# Patient Record
Sex: Female | Born: 1980 | Race: White | Hispanic: No | Smoking: Former smoker
Health system: Southern US, Community
[De-identification: ages and names within clinical notes are randomized; demographics above are authoritative.]

## PROBLEM LIST (undated history)

## (undated) DIAGNOSIS — G43909 Migraine, unspecified, not intractable, without status migrainosus: Secondary | ICD-10-CM

## (undated) DIAGNOSIS — Z98891 History of uterine scar from previous surgery: Secondary | ICD-10-CM

## (undated) DIAGNOSIS — I340 Nonrheumatic mitral (valve) insufficiency: Secondary | ICD-10-CM

## (undated) DIAGNOSIS — Z9851 Tubal ligation status: Secondary | ICD-10-CM

## (undated) HISTORY — PX: CHOLECYSTECTOMY: SHX55

---

## 2012-05-10 ENCOUNTER — Ambulatory Visit: Payer: Self-pay | Admitting: Obstetrics and Gynecology

## 2012-05-10 ENCOUNTER — Ambulatory Visit: Payer: Self-pay | Admitting: Internal Medicine

## 2014-09-15 ENCOUNTER — Emergency Department
Admission: EM | Admit: 2014-09-15 | Discharge: 2014-09-15 | Disposition: A | Discharge status: Home / Self Care | Payer: Self-pay | Attending: Emergency Medicine | Admitting: Emergency Medicine

## 2014-09-15 DIAGNOSIS — Z8669 Personal history of other diseases of the nervous system and sense organs: Secondary | ICD-10-CM | POA: Insufficient documentation

## 2014-09-15 DIAGNOSIS — Z87891 Personal history of nicotine dependence: Secondary | ICD-10-CM | POA: Insufficient documentation

## 2014-09-15 DIAGNOSIS — R519 Headache, unspecified: Secondary | ICD-10-CM

## 2014-09-15 DIAGNOSIS — M62838 Other muscle spasm: Secondary | ICD-10-CM | POA: Insufficient documentation

## 2014-09-15 DIAGNOSIS — R51 Headache: Secondary | ICD-10-CM | POA: Insufficient documentation

## 2014-09-15 HISTORY — DX: Migraine, unspecified, not intractable, without status migrainosus: G43.909

## 2014-09-15 MED ORDER — LORAZEPAM 2 MG/ML IJ SOLN
INTRAMUSCULAR | Status: AC
  Administered 2014-09-15: 1 mg via INTRAVENOUS
  Filled 2014-09-15: qty 1

## 2014-09-15 MED ORDER — HYDROMORPHONE HCL 1 MG/ML IJ SOLN
INTRAMUSCULAR | Status: AC
  Administered 2014-09-15: 0.5 mg via INTRAVENOUS
  Filled 2014-09-15: qty 1

## 2014-09-15 MED ORDER — CYCLOBENZAPRINE HCL 10 MG PO TABS: 10.0000 mg | ORAL_TABLET | Freq: Three times a day (TID) | ORAL | Status: DC | PRN

## 2014-09-15 MED ORDER — HYDROCODONE-ACETAMINOPHEN 5-325 MG PO TABS: 1.0000 | ORAL_TABLET | Freq: Four times a day (QID) | ORAL | Status: DC | PRN

## 2014-09-15 MED ORDER — KETOROLAC TROMETHAMINE 30 MG/ML IJ SOLN
INTRAMUSCULAR | Status: AC
  Administered 2014-09-15: 30 mg via INTRAVENOUS
  Filled 2014-09-15: qty 1

## 2014-09-15 MED ORDER — DIPHENHYDRAMINE HCL 50 MG/ML IJ SOLN
INTRAMUSCULAR | Status: AC
  Administered 2014-09-15: 25 mg via INTRAVENOUS
  Filled 2014-09-15: qty 1

## 2014-09-15 MED ORDER — DIPHENHYDRAMINE HCL 50 MG/ML IJ SOLN
25.0000 mg | Freq: Once | INTRAMUSCULAR | Status: AC
  Administered 2014-09-15: 25 mg via INTRAVENOUS

## 2014-09-15 MED ORDER — HYDROMORPHONE HCL 1 MG/ML IJ SOLN
0.5000 mg | Freq: Once | INTRAMUSCULAR | Status: AC
  Administered 2014-09-15: 0.5 mg via INTRAVENOUS

## 2014-09-15 MED ORDER — SODIUM CHLORIDE 0.9 % IV BOLUS (SEPSIS)
1000.0000 mL | Freq: Once | INTRAVENOUS | Status: AC
  Administered 2014-09-15: 1000 mL via INTRAVENOUS

## 2014-09-15 MED ORDER — KETOROLAC TROMETHAMINE 30 MG/ML IJ SOLN
30.0000 mg | Freq: Once | INTRAMUSCULAR | Status: AC
  Administered 2014-09-15: 30 mg via INTRAVENOUS

## 2014-09-15 MED ORDER — LORAZEPAM 2 MG/ML IJ SOLN
1.0000 mg | Freq: Once | INTRAMUSCULAR | Status: AC
  Administered 2014-09-15: 1 mg via INTRAVENOUS

## 2014-09-15 NOTE — Discharge Instructions (Signed)
No certain cause was a headache, but your exam and evaluation are reassuring. I suspect it started off due to the trapezius muscle spasm. Return to emergency department for any new or worsening headache, specifically any altered mental status, weakness, numbness, fever, skin rash, or any other symptoms concerning to you. Follow up with your doctor or the [facility] this next week. If you have continued headaches, you may call and make an appointment with a neurologist, Dr. Margaretmary Eddy office information was provided.  General Headache Without Cause A headache is pain or discomfort felt around the head or neck area. The specific cause of a headache may not be found. There are many causes and types of headaches. A few common ones are:  Tension headaches.  Migraine headaches.  Cluster headaches.  Chronic daily headaches. HOME CARE INSTRUCTIONS   Keep all follow-up appointments with your caregiver or any specialist referral.  Only take over-the-counter or prescription medicines for pain or discomfort as directed by your caregiver.  Lie down in a dark, quiet room when you have a headache.  Keep a headache journal to find out what may trigger your migraine headaches. For example, write down:  What you eat and drink.  How much sleep you get.  Any change to your diet or medicines.  Try massage or other relaxation techniques.  Put ice packs or heat on the head and neck. Use these 3 to 4 times per day for 15 to 20 minutes each time, or as needed.  Limit stress.  Sit up straight, and do not tense your muscles.  Quit smoking if you smoke.  Limit alcohol use.  Decrease the amount of caffeine you drink, or stop drinking caffeine.  Eat and sleep on a regular schedule.  Get 7 to 9 hours of sleep, or as recommended by your caregiver.  Keep lights dim if bright lights bother you and make your headaches worse. SEEK MEDICAL CARE IF:   You have problems with the medicines you were  prescribed.  Your medicines are not working.  You have a change from the usual headache.  You have nausea or vomiting. SEEK IMMEDIATE MEDICAL CARE IF:   Your headache becomes severe.  You have a fever.  You have a stiff neck.  You have loss of vision.  You have muscular weakness or loss of muscle control.  You start losing your balance or have trouble walking.  You feel faint or pass out.  You have severe symptoms that are different from your first symptoms. MAKE SURE YOU:   Understand these instructions.  Will watch your condition.  Will get help right away if you are not doing well or get worse. Document Released: 03/22/2005 Document Revised: 06/14/2011 Document Reviewed: 04/07/2011 [facility] Patient Information 2015 [place], [place]. This information is not intended to replace advice given to you by your health care provider. Make sure you discuss any questions you have with your health care provider.

## 2014-09-15 NOTE — ED Provider Notes (Signed)
[facility] Emergency Department Provider Note   ____________________________________________  Time seen: 11:45 AM I have reviewed the triage vital signs and the triage nursing note.  HISTORY  Chief Complaint Headache   Historian Patient  HPI Denise Peterson is a 34 y.o. female who is here with a complaint of headache which is been ongoing since about Wednesday and somewhat progressively worsening. She is point tender over the base of the skull especially on the right side which seems to this morning have associated numbness or tingling across the scalp on the right side. She feels like she may have some right eye tearing or pain. No specific visual changes. No recent fever, illness, skin rash, or tick bite. Her typical migraine she is able to take ibuprofen and they go away and typically her migraines are located frontally rather than at this base of the right neck. She is not having any numbness or tingling in her arm. No focal weakness.    Past Medical History  Diagnosis Date  . Migraine     There are no active problems to display for this patient.   Past Surgical History  Procedure Laterality Date  . Cholecystectomy    . Cesarean section      Current Outpatient Rx  Name  Route  Sig  Dispense  Refill  . cyclobenzaprine (FLEXERIL) 10 MG tablet   Oral   Take 1 tablet (10 mg total) by mouth every 8 (eight) hours as needed for muscle spasms.   20 tablet   0   . HYDROcodone-acetaminophen (NORCO/VICODIN) 5-325 MG per tablet   Oral   Take 1 tablet by mouth every 6 (six) hours as needed for moderate pain.   10 tablet   0     Allergies Review of patient's allergies indicates no known allergies.  No family history on file.  Social History History  Substance Use Topics  . Smoking status: Former Games developer  . Smokeless tobacco: Never Used  . Alcohol Use: Yes    Review of Systems  Constitutional: Negative for fever. Eyes: Negative for  visual changes. ENT: Negative for sore throat. Cardiovascular: Negative for chest pain. Respiratory: Negative for shortness of breath. Gastrointestinal: Negative for abdominal pain, vomiting and diarrhea. Genitourinary: Negative for dysuria. Musculoskeletal: Negative for back pain. Skin: Negative for rash. Neurological: Negative for focal weakness or numbness.  ____________________________________________   PHYSICAL EXAM:  VITAL SIGNS: ED Triage Vitals  Enc Vitals Group     BP 09/15/14 1031 130/79 mmHg     Pulse Rate 09/15/14 1031 86     Resp 09/15/14 1031 15     Temp 09/15/14 1031 97.7 F (36.5 C)     Temp Source 09/15/14 1031 Oral     SpO2 09/15/14 1031 97 %     Weight 09/15/14 1031 270 lb (122.471 kg)     Height 09/15/14 1031 5\' 6"  (1.676 m)     Head Cir --      Peak Flow --      Pain Score 09/15/14 1031 7     Pain Loc --      Pain Edu? --      Excl. in GC? --      Constitutional: Alert and oriented. Well appearing and in no distress. Eyes: Sensitive to light. Conjunctivae are normal. PERRL. Normal extraocular movements. Funduscopic exam normal bilaterally. ENT   Head: Normocephalic and atraumatic.   Nose: No congestion/rhinnorhea.   Mouth/Throat: Mucous membranes are moist.   Neck: No  C-spine tenderness. Muscle spasm at the base of the right neck and the right side of the occiput at the insertion of the trapezius. No meningismus Cardiovascular: Normal rate, regular rhythm.  No murmurs, rubs, or gallops. Respiratory: Normal respiratory effort without tachypnea nor retractions. Breath sounds are clear and equal bilaterally. No wheezes/rales/rhonchi. Gastrointestinal: Soft. No distention, no guarding, no rebound. Obese, nontender.  Genitourinary/rectal: Deferred Musculoskeletal: Nontender with normal range of motion in all extremities. No joint effusions.  No lower extremity tenderness nor edema. Neurologic:  Normal speech and language. No gross focal  neurologic deficits are appreciated. Skin:  Skin is warm, dry and intact. No rash noted. Psychiatric: Mood and affect are normal. Speech and behavior are normal. Patient exhibits appropriate insight and judgment.  ____________________________________________   EKG  None ____________________________________________  LABS (pertinent positives/negatives)  None  ____________________________________________  RADIOLOGY Radiologist results reviewed  None __________________________________________  PROCEDURES  Procedure(s) performed: None Critical Care performed: None  ____________________________________________   ED COURSE / ASSESSMENT AND PLAN  Pertinent labs & imaging results that were available during my care of the patient were reviewed by me and considered in my medical decision making (see chart for details).  Patient has what I believe to be a nonspecific headache with associated right-sided basilar occiput/trapezius muscle spasm. She has no focal neurologic deficit, and I do not suspect an intracranial emergency as a cause of her headache.. I do not suspect an ocular source of her headache such as glaucoma.   Patient much improved symptomatically after medications. I still do not suspect intracranial emergency. Patient does report being under increased stress at work as well. Return precautions and discharge instructions were provided to the patient and her spouse.   ___________________________________________   FINAL CLINICAL IMPRESSION(S) / ED DIAGNOSES   Final diagnoses:  Headache, unspecified headache type  Trapezius muscle spasm      Governor Rooks, MD 09/15/14 1457

## 2014-09-15 NOTE — ED Notes (Signed)
Pt c/o pain at the base of right side back of head for the past 2-3 days, states she has a hx of migraine, but normally in frontal with c/o dizziness today.

## 2016-11-02 ENCOUNTER — Other Ambulatory Visit: Payer: Self-pay

## 2016-11-02 ENCOUNTER — Observation Stay
Admission: EM | Admit: 2016-11-02 | Discharge: 2016-11-04 | Disposition: A | Discharge status: Home / Self Care | Payer: Medicaid Other | Attending: Internal Medicine | Admitting: Internal Medicine

## 2016-11-02 ENCOUNTER — Emergency Department: Payer: Medicaid Other

## 2016-11-02 ENCOUNTER — Encounter: Payer: Self-pay | Admitting: Emergency Medicine

## 2016-11-02 DIAGNOSIS — G43909 Migraine, unspecified, not intractable, without status migrainosus: Secondary | ICD-10-CM | POA: Diagnosis present

## 2016-11-02 DIAGNOSIS — Z9049 Acquired absence of other specified parts of digestive tract: Secondary | ICD-10-CM | POA: Insufficient documentation

## 2016-11-02 DIAGNOSIS — Z87891 Personal history of nicotine dependence: Secondary | ICD-10-CM | POA: Insufficient documentation

## 2016-11-02 DIAGNOSIS — G43109 Migraine with aura, not intractable, without status migrainosus: Secondary | ICD-10-CM | POA: Diagnosis not present

## 2016-11-02 DIAGNOSIS — R531 Weakness: Secondary | ICD-10-CM | POA: Diagnosis present

## 2016-11-02 DIAGNOSIS — G9389 Other specified disorders of brain: Secondary | ICD-10-CM | POA: Diagnosis not present

## 2016-11-02 DIAGNOSIS — I639 Cerebral infarction, unspecified: Secondary | ICD-10-CM | POA: Diagnosis present

## 2016-11-02 DIAGNOSIS — Z79899 Other long term (current) drug therapy: Secondary | ICD-10-CM | POA: Diagnosis not present

## 2016-11-02 DIAGNOSIS — I491 Atrial premature depolarization: Secondary | ICD-10-CM | POA: Insufficient documentation

## 2016-11-02 LAB — DIFFERENTIAL
Basophils Absolute: 0.2 10*3/uL — ABNORMAL HIGH (ref 0–0.1)
Basophils Relative: 1 %
EOS ABS: 0.5 10*3/uL (ref 0–0.7)
Eosinophils Relative: 3 %
LYMPHS PCT: 33 %
Lymphs Abs: 4.5 10*3/uL — ABNORMAL HIGH (ref 1.0–3.6)
Monocytes Absolute: 1 10*3/uL — ABNORMAL HIGH (ref 0.2–0.9)
Monocytes Relative: 7 %
NEUTROS PCT: 56 %
Neutro Abs: 7.3 10*3/uL — ABNORMAL HIGH (ref 1.4–6.5)

## 2016-11-02 LAB — COMPREHENSIVE METABOLIC PANEL
ALBUMIN: 4 g/dL (ref 3.5–5.0)
ALK PHOS: 59 U/L (ref 38–126)
ALT: 28 U/L (ref 14–54)
ANION GAP: 9 (ref 5–15)
AST: 28 U/L (ref 15–41)
BUN: 9 mg/dL (ref 6–20)
CALCIUM: 9.2 mg/dL (ref 8.9–10.3)
CO2: 25 mmol/L (ref 22–32)
Chloride: 104 mmol/L (ref 101–111)
Creatinine, Ser: 0.8 mg/dL (ref 0.44–1.00)
GFR calc Af Amer: >60 mL/min (ref >60)
GFR calc non Af Amer: >60 mL/min (ref >60)
Glucose, Bld: 103 mg/dL — ABNORMAL HIGH (ref 65–99)
Potassium: 3.7 mmol/L (ref 3.5–5.1)
Sodium: 138 mmol/L (ref 135–145)
Total Bilirubin: 0.4 mg/dL (ref 0.3–1.2)
Total Protein: 8 g/dL (ref 6.5–8.1)

## 2016-11-02 LAB — PROTIME-INR
INR: 0.92
Prothrombin Time: 12.4 seconds (ref 11.4–15.2)

## 2016-11-02 LAB — CBC
HCT: 40.4 % (ref 35.0–47.0)
Hemoglobin: 13.9 g/dL (ref 12.0–16.0)
MCH: 31.6 pg (ref 26.0–34.0)
MCHC: 34.3 g/dL (ref 32.0–36.0)
MCV: 91.9 fL (ref 80.0–100.0)
Platelets: 332 10*3/uL (ref 150–440)
RBC: 4.39 MIL/uL (ref 3.80–5.20)
RDW: 13.3 % (ref 11.5–14.5)
WBC: 13.4 10*3/uL — ABNORMAL HIGH (ref 3.6–11.0)

## 2016-11-02 LAB — GLUCOSE, CAPILLARY: Glucose-Capillary: 107 mg/dL — ABNORMAL HIGH (ref 65–99)

## 2016-11-02 LAB — APTT: aPTT: 26 seconds (ref 24–36)

## 2016-11-02 LAB — HCG, QUANTITATIVE, PREGNANCY

## 2016-11-02 LAB — TROPONIN I: Troponin I: <0.03 ng/mL (ref <0.03)

## 2016-11-02 MED ORDER — KETOROLAC TROMETHAMINE 30 MG/ML IJ SOLN
15.0000 mg | Freq: Once | INTRAMUSCULAR | Status: AC
  Administered 2016-11-02: 15 mg via INTRAVENOUS
  Filled 2016-11-02: qty 1

## 2016-11-02 MED ORDER — METOCLOPRAMIDE HCL 5 MG/ML IJ SOLN
10.0000 mg | Freq: Once | INTRAMUSCULAR | Status: AC
  Administered 2016-11-02: 10 mg via INTRAVENOUS
  Filled 2016-11-02: qty 2

## 2016-11-02 MED ORDER — SODIUM CHLORIDE 0.9 % IV BOLUS (SEPSIS)
1000.0000 mL | Freq: Once | INTRAVENOUS | Status: AC
  Administered 2016-11-02: 1000 mL via INTRAVENOUS

## 2016-11-02 MED ORDER — GADOBENATE DIMEGLUMINE 529 MG/ML IV SOLN
20.0000 mL | Freq: Once | INTRAVENOUS | Status: AC | PRN
  Administered 2016-11-02: 20 mL via INTRAVENOUS

## 2016-11-02 MED ORDER — DIPHENHYDRAMINE HCL 50 MG/ML IJ SOLN
25.0000 mg | Freq: Once | INTRAMUSCULAR | Status: AC
  Administered 2016-11-02: 25 mg via INTRAVENOUS
  Filled 2016-11-02: qty 1

## 2016-11-02 MED ORDER — ASPIRIN 81 MG PO CHEW
324.0000 mg | CHEWABLE_TABLET | Freq: Once | ORAL | Status: AC
  Administered 2016-11-02: 324 mg via ORAL
  Filled 2016-11-02: qty 4

## 2016-11-02 NOTE — ED Triage Notes (Signed)
Pt presents with left sided weakness and numbness since 1730 today, reports intermittent left eye vision loss, left sided headache.

## 2016-11-02 NOTE — ED Notes (Signed)
Pt alert  Speech clear.  No headache.  Skin warm and dry.  Friends with pt.  Pt waiting on admission.  Iv in place.

## 2016-11-02 NOTE — ED Notes (Signed)
SOC talking to pt. 

## 2016-11-02 NOTE — ED Notes (Signed)
Pt taken to MRI by Jill AlexandersJustin, MRI tech.

## 2016-11-02 NOTE — ED Notes (Signed)
Family member at bedside.

## 2016-11-02 NOTE — ED Notes (Signed)
Pt in CT.

## 2016-11-02 NOTE — ED Notes (Signed)
Pt denies wanting TPA

## 2016-11-02 NOTE — H&P (Signed)
[facility] [place] at [facility]   PATIENT NAME: Denise Peterson Whitson    MR#:  [id number]  DATE OF BIRTH:  07/11/1980  DATE OF ADMISSION:  11/02/2016  PRIMARY CARE PHYSICIAN: Patient, No Pcp Per   REQUESTING/REFERRING PHYSICIAN: Don PerkingVeronese, MD  CHIEF COMPLAINT:   Chief Complaint  Patient presents with  . Code Stroke    HISTORY OF PRESENT ILLNESS:  Denise Peterson Lizarraga  is a 36 y.o. female who presents with Left hemisensory deficit. Patient states that her left side feels heavy, like it is "asleep". Here in the ED patient got an MRI which did not show any acute pathology, but it did show a region of her cerebellum concerning for possible remote stroke. MRA and MRV were normal. Patient doesn't history of migraines, states that she did have a headache on the left side of her head today. Her systems are somewhat persistent here in the ED, improving some when she got migraine treatment medications. Hospitalists were called for admission  PAST MEDICAL HISTORY:   Past Medical History:  Diagnosis Date  . Migraine     PAST SURGICAL HISTORY:   Past Surgical History:  Procedure Laterality Date  . CESAREAN SECTION    . CHOLECYSTECTOMY      SOCIAL HISTORY:   Social History  Substance Use Topics  . Smoking status: Former Games developermoker  . Smokeless tobacco: Never Used  . Alcohol use Yes    FAMILY HISTORY:  No family history on file.  DRUG ALLERGIES:  No Known Allergies  MEDICATIONS AT HOME:   Prior to Admission medications   Medication Sig Start Date End Date Taking? Authorizing Provider  cyclobenzaprine (FLEXERIL) 10 MG tablet Take 1 tablet (10 mg total) by mouth every 8 (eight) hours as needed for muscle spasms. Patient not taking: Reported on 11/02/2016 09/15/14   Governor RooksLord, Rebecca, MD  HYDROcodone-acetaminophen (NORCO/VICODIN) 5-325 MG per tablet Take 1 tablet by mouth every 6 (six) hours as needed for moderate pain. Patient not taking: Reported on 11/02/2016  09/15/14   Governor RooksLord, Rebecca, MD    REVIEW OF SYSTEMS:  Review of Systems  Constitutional: Negative for chills, fever, malaise/fatigue and weight loss.  HENT: Negative for ear pain, hearing loss and tinnitus.   Eyes: Negative for blurred vision, double vision, pain and redness.  Respiratory: Negative for cough, hemoptysis and shortness of breath.   Cardiovascular: Negative for chest pain, palpitations, orthopnea and leg swelling.  Gastrointestinal: Negative for abdominal pain, constipation, diarrhea, nausea and vomiting.  Genitourinary: Negative for dysuria, frequency and hematuria.  Musculoskeletal: Negative for back pain, joint pain and neck pain.  Skin:       No acne, rash, or lesions  Neurological: Positive for sensory change and headaches. Negative for dizziness, tremors, focal weakness and weakness.  Endo/Heme/Allergies: Negative for polydipsia. Does not bruise/bleed easily.  Psychiatric/Behavioral: Negative for depression. The patient is not nervous/anxious and does not have insomnia.      VITAL SIGNS:   Vitals:   11/02/16 2000 11/02/16 2031 11/02/16 2230 11/02/16 2300  BP: 101/64 115/64 110/77 104/84  Pulse: 75  72 75  Resp: (!) 22 16 14  (!) 22  Temp:      TempSrc:      SpO2: 96%  97% 98%  Weight:      Height:       Wt Readings from Last 3 Encounters:  11/02/16 122.5 kg (270 lb)  09/15/14 122.5 kg (270 lb)    PHYSICAL EXAMINATION:  Physical Exam  Vitals reviewed.  Constitutional: She is oriented to person, place, and time. She appears well-developed and well-nourished. No distress.  HENT:  Head: Normocephalic and atraumatic.  Mouth/Throat: Oropharynx is clear and moist.  Eyes: Pupils are equal, round, and reactive to light. Conjunctivae and EOM are normal. No scleral icterus.  Neck: Normal range of motion. Neck supple. No JVD present. No thyromegaly present.  Cardiovascular: Normal rate, regular rhythm and intact distal pulses.  Exam reveals no gallop and no friction  rub.   No murmur heard. Respiratory: Effort normal and breath sounds normal. No respiratory distress. She has no wheezes. She has no rales.  GI: Soft. Bowel sounds are normal. She exhibits no distension. There is no tenderness.  Musculoskeletal: Normal range of motion. She exhibits no edema.  No arthritis, no gout  Lymphadenopathy:    She has no cervical adenopathy.  Neurological: She is alert and oriented to person, place, and time. No cranial nerve deficit.  Neurologic: Cranial nerves II-XII intact, Sensation intact to light touch/pinprick on the right side, decreased sensation to light touch on the left face and upper and lower extremities, 5/5 strength in right upper and lower extremities, 4/5 strength in left upper and lower extremities, no dysarthria, no aphasia, no dysphagia, memory intact, no pronator drift  Skin: Skin is warm and dry. No rash noted. No erythema.  Psychiatric: She has a normal mood and affect. Her behavior is normal. Judgment and thought content normal.    LABORATORY PANEL:   CBC  Recent Labs Lab 11/02/16 1816  WBC 13.4*  HGB 13.9  HCT 40.4  PLT 332   ------------------------------------------------------------------------------------------------------------------  Chemistries   Recent Labs Lab 11/02/16 1816  NA 138  K 3.7  CL 104  CO2 25  GLUCOSE 103*  BUN 9  CREATININE 0.80  CALCIUM 9.2  AST 28  ALT 28  ALKPHOS 59  BILITOT 0.4   ------------------------------------------------------------------------------------------------------------------  Cardiac Enzymes  Recent Labs Lab 11/02/16 1816  TROPONINI <0.03   ------------------------------------------------------------------------------------------------------------------  RADIOLOGY:  Mr Maxine Glenn Head Wo Contrast  Result Date: 11/02/2016 CLINICAL DATA:  Initial evaluation for left-sided weakness and numbness with intermittent left-sided visual loss, left-sided headache. EXAM: MRI HEAD  WITHOUT CONTRAST MRA HEAD WITHOUT CONTRAST MRA NECK WITHOUT AND WITH CONTRAST MRV HEAD WITHOUT CONTRAST TECHNIQUE: Multiplanar, multiecho pulse sequences of the brain and surrounding structures were obtained without intravenous contrast. Angiographic images of the Circle of Rylei Masella were obtained using MRA technique without intravenous contrast. Angiographic images of the neck were obtained using MRA technique without and with intravenous contrast. Carotid stenosis measurements (when applicable) are obtained utilizing NASCET criteria, using the distal internal carotid diameter as the denominator. CONTRAST:  20mL MULTIHANCE GADOBENATE DIMEGLUMINE 529 MG/ML IV SOLN COMPARISON:  Prior CT from earlier the same day. FINDINGS: MRI HEAD FINDINGS Cerebral volume within normal limits for patient age. Few scattered T2/FLAIR hyperintense foci noted within the periventricular and deep white matter of the left cerebral hemisphere. Most prominent of these foci in noted within the left periatrial white matter node and measures 1 cm (series 8, image 14). These foci are nonspecific. Two adjacent foci of encephalomalacia within the inferior left cerebellar hemisphere suspected to be related to remote ischemia (series 7, image 6). No abnormal foci of restricted diffusion to suggest acute or subacute ischemia. Gray-white matter differentiation otherwise maintained. No other evidence for chronic infarction. Single tiny foci of susceptibility artifact within the mid pons noted, consistent with a small chronic microhemorrhage (series 9, image 9). No other evidence for acute or  chronic intracranial hemorrhage. No mass lesion, midline shift, or mass effect. No hydrocephalus. No extra-axial fluid collection. Major dural sinuses are grossly patent. Incidental note made of a partially empty sella. Pituitary gland suprasellar region otherwise unremarkable. Midline structures intact and normal. Major intracranial vascular flow voids are  maintained. Craniocervical junction within normal limits. Visualized upper cervical spine unremarkable. Bone marrow signal intensity within normal limits. No scalp soft tissue abnormality. Globes and orbital soft tissues within normal limits. Paranasal sinuses are clear. No mastoid effusion. Inner ear structures grossly normal. MRA HEAD FINDINGS ANTERIOR CIRCULATION: Distal cervical segments of the internal carotid arteries are widely patent with antegrade flow. Petrous, cavernous, and supraclinoid segments widely patent bilaterally without stenosis. Origin of the ophthalmic arteries patent. ICA termini widely patent. A1 segments widely patent. Anterior cerebral arteries widely patent to their distal aspects. M1 segments patent without stenosis or occlusion. MCA bifurcations normal. No proximal M2 occlusion. Distal MCA branches well opacified and symmetric parent POSTERIOR CIRCULATION: Vertebral artery's patent to the vertebrobasilar junction without stenosis. Posterior inferior cerebral arteries patent proximally. Basilar artery widely patent. Superior cerebral arteries patent bilaterally. Both of the posterior cerebral arteries primarily supplied via the basilar and are widely patent to their distal aspects. Small bilateral posterior communicating arteries noted. No aneurysm or vascular malformation. MRV HEAD FINDINGS: The superior sagittal sinus widely patent. Torcula widely patent. Right transverse and sigmoid sinuses are widely patent and dominant. Proximal right internal jugular vein patent. Left transverse and sigmoid sinuses are hypoplastic but patent as well. Proximal left internal jugular vein patent. No obvious abrupt cut off to suggest dural sinus stenosis. Straight sinus, vein of Galen, and internal cerebral veins patent bilaterally. No obvious cortical vein thrombosis. The MRA NECK FINDINGS Source images reviewed. Visualized aortic arch of normal caliber with normal branch pattern. No high-grade  flow-limiting stenosis about the origin of the great vessels. Visualized subclavian arteries widely patent. Right common and internal carotid artery's are widely patent from their origins to the skullbase without stenosis or occlusion. No significant atheromatous irregularity or narrowing about the right carotid bifurcation. Left common and internal carotid arteries are widely patent from their origins to the skullbase without stenosis or occlusion. No significant atheromatous irregularity or narrowing about the left carotid bifurcation. Both of the vertebral arteries arise from the subclavian arteries. Vertebral arteries are largely code dominant. Vertebral arteries widely patent within the neck without stenosis or occlusion. IMPRESSION: MRI HEAD IMPRESSION: 1. No acute intracranial process identified. 2. Few patchy T2/FLAIR hyperintense foci involving the periventricular and deep white matter of the left cerebral hemisphere as above, somewhat advanced for age. These foci are nonspecific. Primary differential considerations include accelerated chronic small vessel ischemic disease, sequelae of migrainous disorder, vasculitis, or other prior infectious or inflammatory process. Although not classic for demyelinating disease, this is not entirely excluded, and could be considered in the correct clinical setting. 3. 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 4. Otherwise normal brain MRI. MRA HEAD IMPRESSION: Normal intracranial MRA. MRV HEAD IMPRESSION: Normal intracranial MRV. MRA NECK IMPRESSION: Normal MRA of the neck. Electronically Signed   By: Rise Mu M.D.   On: 11/02/2016 22:41   Mr Angiogram Neck W Or Wo Contrast  Result Date: 11/02/2016 CLINICAL DATA:  Initial evaluation for left-sided weakness and numbness with intermittent left-sided visual loss, left-sided headache. EXAM: MRI HEAD WITHOUT CONTRAST MRA  HEAD WITHOUT CONTRAST MRA NECK WITHOUT AND WITH CONTRAST MRV HEAD  WITHOUT CONTRAST TECHNIQUE: Multiplanar, multiecho pulse sequences of the brain and surrounding structures were obtained without intravenous contrast. Angiographic images of the Circle of Jakevious Hollister were obtained using MRA technique without intravenous contrast. Angiographic images of the neck were obtained using MRA technique without and with intravenous contrast. Carotid stenosis measurements (when applicable) are obtained utilizing NASCET criteria, using the distal internal carotid diameter as the denominator. CONTRAST:  20mL MULTIHANCE GADOBENATE DIMEGLUMINE 529 MG/ML IV SOLN COMPARISON:  Prior CT from earlier the same day. FINDINGS: MRI HEAD FINDINGS Cerebral volume within normal limits for patient age. Few scattered T2/FLAIR hyperintense foci noted within the periventricular and deep white matter of the left cerebral hemisphere. Most prominent of these foci in noted within the left periatrial white matter node and measures 1 cm (series 8, image 14). These foci are nonspecific. Two adjacent foci of encephalomalacia within the inferior left cerebellar hemisphere suspected to be related to remote ischemia (series 7, image 6). No abnormal foci of restricted diffusion to suggest acute or subacute ischemia. Gray-white matter differentiation otherwise maintained. No other evidence for chronic infarction. Single tiny foci of susceptibility artifact within the mid pons noted, consistent with a small chronic microhemorrhage (series 9, image 9). No other evidence for acute or chronic intracranial hemorrhage. No mass lesion, midline shift, or mass effect. No hydrocephalus. No extra-axial fluid collection. Major dural sinuses are grossly patent. Incidental note made of a partially empty sella. Pituitary gland suprasellar region otherwise unremarkable. Midline structures intact and normal. Major intracranial vascular flow voids are maintained. Craniocervical  junction within normal limits. Visualized upper cervical spine unremarkable. Bone marrow signal intensity within normal limits. No scalp soft tissue abnormality. Globes and orbital soft tissues within normal limits. Paranasal sinuses are clear. No mastoid effusion. Inner ear structures grossly normal. MRA HEAD FINDINGS ANTERIOR CIRCULATION: Distal cervical segments of the internal carotid arteries are widely patent with antegrade flow. Petrous, cavernous, and supraclinoid segments widely patent bilaterally without stenosis. Origin of the ophthalmic arteries patent. ICA termini widely patent. A1 segments widely patent. Anterior cerebral arteries widely patent to their distal aspects. M1 segments patent without stenosis or occlusion. MCA bifurcations normal. No proximal M2 occlusion. Distal MCA branches well opacified and symmetric parent POSTERIOR CIRCULATION: Vertebral artery's patent to the vertebrobasilar junction without stenosis. Posterior inferior cerebral arteries patent proximally. Basilar artery widely patent. Superior cerebral arteries patent bilaterally. Both of the posterior cerebral arteries primarily supplied via the basilar and are widely patent to their distal aspects. Small bilateral posterior communicating arteries noted. No aneurysm or vascular malformation. MRV HEAD FINDINGS: The superior sagittal sinus widely patent. Torcula widely patent. Right transverse and sigmoid sinuses are widely patent and dominant. Proximal right internal jugular vein patent. Left transverse and sigmoid sinuses are hypoplastic but patent as well. Proximal left internal jugular vein patent. No obvious abrupt cut off to suggest dural sinus stenosis. Straight sinus, vein of Galen, and internal cerebral veins patent bilaterally. No obvious cortical vein thrombosis. The MRA NECK FINDINGS Source images reviewed. Visualized aortic arch of normal caliber with normal branch pattern. No high-grade flow-limiting stenosis about the  origin of the great vessels. Visualized subclavian arteries widely patent. Right common and internal carotid artery's are widely patent from their origins to the skullbase without stenosis or occlusion. No significant atheromatous irregularity or narrowing about the right carotid bifurcation. Left common and internal carotid arteries are widely patent from their origins to the skullbase without stenosis or occlusion. No significant atheromatous irregularity or narrowing about the left carotid  bifurcation. Both of the vertebral arteries arise from the subclavian arteries. Vertebral arteries are largely code dominant. Vertebral arteries widely patent within the neck without stenosis or occlusion. IMPRESSION: MRI HEAD IMPRESSION: 1. No acute intracranial process identified. 2. Few patchy T2/FLAIR hyperintense foci involving the periventricular and deep white matter of the left cerebral hemisphere as above, somewhat advanced for age. These foci are nonspecific. Primary differential considerations include accelerated chronic small vessel ischemic disease, sequelae of migrainous disorder, vasculitis, or other prior infectious or inflammatory process. Although not classic for demyelinating disease, this is not entirely excluded, and could be considered in the correct clinical setting. 3. 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 4. Otherwise normal brain MRI. MRA HEAD IMPRESSION: Normal intracranial MRA. MRV HEAD IMPRESSION: Normal intracranial MRV. MRA NECK IMPRESSION: Normal MRA of the neck. Electronically Signed   By: Rise MuBenjamin  McClintock M.D.   On: 11/02/2016 22:41   Mr Brain Wo Contrast  Result Date: 11/02/2016 CLINICAL DATA:  Initial evaluation for left-sided weakness and numbness with intermittent left-sided visual loss, left-sided headache. EXAM: MRI HEAD WITHOUT CONTRAST MRA HEAD WITHOUT CONTRAST MRA NECK WITHOUT AND  WITH CONTRAST MRV HEAD WITHOUT CONTRAST TECHNIQUE: Multiplanar, multiecho pulse sequences of the brain and surrounding structures were obtained without intravenous contrast. Angiographic images of the Circle of Humza [place]erico were obtained using MRA technique without intravenous contrast. Angiographic images of the neck were obtained using MRA technique without and with intravenous contrast. Carotid stenosis measurements (when applicable) are obtained utilizing NASCET criteria, using the distal internal carotid diameter as the denominator. CONTRAST:  20mL MULTIHANCE GADOBENATE DIMEGLUMINE 529 MG/ML IV SOLN COMPARISON:  Prior CT from earlier the same day. FINDINGS: MRI HEAD FINDINGS Cerebral volume within normal limits for patient age. Few scattered T2/FLAIR hyperintense foci noted within the periventricular and deep white matter of the left cerebral hemisphere. Most prominent of these foci in noted within the left periatrial white matter node and measures 1 cm (series 8, image 14). These foci are nonspecific. Two adjacent foci of encephalomalacia within the inferior left cerebellar hemisphere suspected to be related to remote ischemia (series 7, image 6). No abnormal foci of restricted diffusion to suggest acute or subacute ischemia. Gray-white matter differentiation otherwise maintained. No other evidence for chronic infarction. Single tiny foci of susceptibility artifact within the mid pons noted, consistent with a small chronic microhemorrhage (series 9, image 9). No other evidence for acute or chronic intracranial hemorrhage. No mass lesion, midline shift, or mass effect. No hydrocephalus. No extra-axial fluid collection. Major dural sinuses are grossly patent. Incidental note made of a partially empty sella. Pituitary gland suprasellar region otherwise unremarkable. Midline structures intact and normal. Major intracranial vascular flow voids are maintained. Craniocervical junction within normal limits. Visualized upper  cervical spine unremarkable. Bone marrow signal intensity within normal limits. No scalp soft tissue abnormality. Globes and orbital soft tissues within normal limits. Paranasal sinuses are clear. No mastoid effusion. Inner ear structures grossly normal. MRA HEAD FINDINGS ANTERIOR CIRCULATION: Distal cervical segments of the internal carotid arteries are widely patent with antegrade flow. Petrous, cavernous, and supraclinoid segments widely patent bilaterally without stenosis. Origin of the ophthalmic arteries patent. ICA termini widely patent. A1 segments widely patent. Anterior cerebral arteries widely patent to their distal aspects. M1 segments patent without stenosis or occlusion. MCA bifurcations normal. No proximal M2 occlusion. Distal MCA branches well opacified and symmetric parent POSTERIOR CIRCULATION: Vertebral artery's patent to the vertebrobasilar junction  without stenosis. Posterior inferior cerebral arteries patent proximally. Basilar artery widely patent. Superior cerebral arteries patent bilaterally. Both of the posterior cerebral arteries primarily supplied via the basilar and are widely patent to their distal aspects. Small bilateral posterior communicating arteries noted. No aneurysm or vascular malformation. MRV HEAD FINDINGS: The superior sagittal sinus widely patent. Torcula widely patent. Right transverse and sigmoid sinuses are widely patent and dominant. Proximal right internal jugular vein patent. Left transverse and sigmoid sinuses are hypoplastic but patent as well. Proximal left internal jugular vein patent. No obvious abrupt cut off to suggest dural sinus stenosis. Straight sinus, vein of Galen, and internal cerebral veins patent bilaterally. No obvious cortical vein thrombosis. The MRA NECK FINDINGS Source images reviewed. Visualized aortic arch of normal caliber with normal branch pattern. No high-grade flow-limiting stenosis about the origin of the great vessels. Visualized  subclavian arteries widely patent. Right common and internal carotid artery's are widely patent from their origins to the skullbase without stenosis or occlusion. No significant atheromatous irregularity or narrowing about the right carotid bifurcation. Left common and internal carotid arteries are widely patent from their origins to the skullbase without stenosis or occlusion. No significant atheromatous irregularity or narrowing about the left carotid bifurcation. Both of the vertebral arteries arise from the subclavian arteries. Vertebral arteries are largely code dominant. Vertebral arteries widely patent within the neck without stenosis or occlusion. IMPRESSION: MRI HEAD IMPRESSION: 1. No acute intracranial process identified. 2. Few patchy T2/FLAIR hyperintense foci involving the periventricular and deep white matter of the left cerebral hemisphere as above, somewhat advanced for age. These foci are nonspecific. Primary differential considerations include accelerated chronic small vessel ischemic disease, sequelae of migrainous disorder, vasculitis, or other prior infectious or inflammatory process. Although not classic for demyelinating disease, this is not entirely excluded, and could be considered in the correct clinical setting. 3. 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 4. Otherwise normal brain MRI. MRA HEAD IMPRESSION: Normal intracranial MRA. MRV HEAD IMPRESSION: Normal intracranial MRV. MRA NECK IMPRESSION: Normal MRA of the neck. Electronically Signed   By: Rise Mu M.D.   On: 11/02/2016 22:41   Mr Mrv Head Wo Cm  Result Date: 11/02/2016 CLINICAL DATA:  Initial evaluation for left-sided weakness and numbness with intermittent left-sided visual loss, left-sided headache. EXAM: MRI HEAD WITHOUT CONTRAST MRA HEAD WITHOUT CONTRAST MRA NECK WITHOUT AND WITH CONTRAST MRV HEAD WITHOUT CONTRAST  TECHNIQUE: Multiplanar, multiecho pulse sequences of the brain and surrounding structures were obtained without intravenous contrast. Angiographic images of the Circle of Habeeb Puertas were obtained using MRA technique without intravenous contrast. Angiographic images of the neck were obtained using MRA technique without and with intravenous contrast. Carotid stenosis measurements (when applicable) are obtained utilizing NASCET criteria, using the distal internal carotid diameter as the denominator. CONTRAST:  20mL MULTIHANCE GADOBENATE DIMEGLUMINE 529 MG/ML IV SOLN COMPARISON:  Prior CT from earlier the same day. FINDINGS: MRI HEAD FINDINGS Cerebral volume within normal limits for patient age. Few scattered T2/FLAIR hyperintense foci noted within the periventricular and deep white matter of the left cerebral hemisphere. Most prominent of these foci in noted within the left periatrial white matter node and measures 1 cm (series 8, image 14). These foci are nonspecific. Two adjacent foci of encephalomalacia within the inferior left cerebellar hemisphere suspected to be related to remote ischemia (series 7, image 6). No abnormal foci of restricted diffusion to suggest acute or subacute ischemia. Gray-white  matter differentiation otherwise maintained. No other evidence for chronic infarction. Single tiny foci of susceptibility artifact within the mid pons noted, consistent with a small chronic microhemorrhage (series 9, image 9). No other evidence for acute or chronic intracranial hemorrhage. No mass lesion, midline shift, or mass effect. No hydrocephalus. No extra-axial fluid collection. Major dural sinuses are grossly patent. Incidental note made of a partially empty sella. Pituitary gland suprasellar region otherwise unremarkable. Midline structures intact and normal. Major intracranial vascular flow voids are maintained. Craniocervical junction within normal limits. Visualized upper cervical spine unremarkable. Bone  marrow signal intensity within normal limits. No scalp soft tissue abnormality. Globes and orbital soft tissues within normal limits. Paranasal sinuses are clear. No mastoid effusion. Inner ear structures grossly normal. MRA HEAD FINDINGS ANTERIOR CIRCULATION: Distal cervical segments of the internal carotid arteries are widely patent with antegrade flow. Petrous, cavernous, and supraclinoid segments widely patent bilaterally without stenosis. Origin of the ophthalmic arteries patent. ICA termini widely patent. A1 segments widely patent. Anterior cerebral arteries widely patent to their distal aspects. M1 segments patent without stenosis or occlusion. MCA bifurcations normal. No proximal M2 occlusion. Distal MCA branches well opacified and symmetric parent POSTERIOR CIRCULATION: Vertebral artery's patent to the vertebrobasilar junction without stenosis. Posterior inferior cerebral arteries patent proximally. Basilar artery widely patent. Superior cerebral arteries patent bilaterally. Both of the posterior cerebral arteries primarily supplied via the basilar and are widely patent to their distal aspects. Small bilateral posterior communicating arteries noted. No aneurysm or vascular malformation. MRV HEAD FINDINGS: The superior sagittal sinus widely patent. Torcula widely patent. Right transverse and sigmoid sinuses are widely patent and dominant. Proximal right internal jugular vein patent. Left transverse and sigmoid sinuses are hypoplastic but patent as well. Proximal left internal jugular vein patent. No obvious abrupt cut off to suggest dural sinus stenosis. Straight sinus, vein of Galen, and internal cerebral veins patent bilaterally. No obvious cortical vein thrombosis. The MRA NECK FINDINGS Source images reviewed. Visualized aortic arch of normal caliber with normal branch pattern. No high-grade flow-limiting stenosis about the origin of the great vessels. Visualized subclavian arteries widely patent. Right  common and internal carotid artery's are widely patent from their origins to the skullbase without stenosis or occlusion. No significant atheromatous irregularity or narrowing about the right carotid bifurcation. Left common and internal carotid arteries are widely patent from their origins to the skullbase without stenosis or occlusion. No significant atheromatous irregularity or narrowing about the left carotid bifurcation. Both of the vertebral arteries arise from the subclavian arteries. Vertebral arteries are largely code dominant. Vertebral arteries widely patent within the neck without stenosis or occlusion. IMPRESSION: MRI HEAD IMPRESSION: 1. No acute intracranial process identified. 2. Few patchy T2/FLAIR hyperintense foci involving the periventricular and deep white matter of the left cerebral hemisphere as above, somewhat advanced for age. These foci are nonspecific. Primary differential considerations include accelerated chronic small vessel ischemic disease, sequelae of migrainous disorder, vasculitis, or other prior infectious or inflammatory process. Although not classic for demyelinating disease, this is not entirely excluded, and could be considered in the correct clinical setting. 3. 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 4. Otherwise normal brain MRI. MRA HEAD IMPRESSION: Normal intracranial MRA. MRV HEAD IMPRESSION: Normal intracranial MRV. MRA NECK IMPRESSION: Normal MRA of the neck. Electronically Signed   By: Rise Mu M.D.   On: 11/02/2016 22:41   Ct Head Code Stroke W/o Cm  Result Date:  11/02/2016 CLINICAL DATA:  Code stroke. Left-sided numbness. Onset 45 minutes prior to admission EXAM: CT HEAD WITHOUT CONTRAST TECHNIQUE: Contiguous axial images were obtained from the base of the skull through the vertex without intravenous contrast. COMPARISON:  None. FINDINGS: Brain: No evidence of  acute infarction, hemorrhage, hydrocephalus, extra-axial collection or mass lesion/mass effect. Vascular: No hyperdense vessel or unexpected calcification. Skull: Negative Sinuses/Orbits: Negative Other: None ASPECTS ([place]berta Stroke Program Early CT Score) - Ganglionic level infarction (caudate, lentiform nuclei, internal capsule, insula, M1-M3 cortex): 7 - Supraganglionic infarction (M4-M6 cortex): 3 Total score (0-10 with 10 being normal): 10 IMPRESSION: 1. Negative CT Head 2. ASPECTS is 10 These results were called by telephone at the time of interpretation on 11/02/2016 at 6:20 pm to Dr. Roxan Hockey, who verbally acknowledged these results. Electronically Signed   By: Marlan Palau M.D.   On: 11/02/2016 18:21    EKG:   Orders placed or performed during the hospital encounter of 11/02/16  . ED EKG  . ED EKG    IMPRESSION AND PLAN:  Principal Problem:   Stroke [facility]) - Patient has an area of her cerebellum which shows potential old lesion, though this is in the setting of a normal MRA. MRI does not show acute stroke, and certainly does not show stroke in any region that would correspond to her current symptoms. We will get a neurology consult, and try and treat her headache, which could be an atypical migraine with neurologic manifestations, as below. Active Problems:   Migraines - patient received some medication the ED with some improvement in her headache, as well as some mild improvement in her neurologic symptoms. We will continue to treat her migraine headaches throughout the night.  All the records are reviewed and case discussed with ED provider. Management plans discussed with the patient and/or family.  DVT PROPHYLAXIS: SubQ lovenox  GI PROPHYLAXIS: None  ADMISSION STATUS: Observation  CODE STATUS: Full Code Status History    This patient does not have a recorded code status. Please follow your organizational policy for patients in this situation.      TOTAL TIME TAKING CARE OF  THIS PATIENT: 40 minutes.   Beaulah Romanek FIELDING 11/02/2016, 11:31 PM  [facility]  Office  [phone]  CC: Primary care physician; Patient, No Pcp Per  Note:  This document was prepared using Dragon voice recognition software and may include unintentional dictation errors.

## 2016-11-02 NOTE — ED Provider Notes (Signed)
[facility] Emergency Department Provider Note  ____________________________________________  Time seen: Approximately 7:45 PM  I have reviewed the triage vital signs and the nursing notes.   HISTORY  Chief Complaint Code Stroke   HPI Denise Peterson is a 36 y.o. female history migraine headaches who presents for evaluation of headache and left-sided weakness.Patient complaining of the sudden onset of left temporal headache that she describes as 6 out of 10 associated with a left eye blurry vision, left arm and leg numbness and "heavy feeling", and numbness on the L face. Patient reports photophobia. Patient denies ever having a migraine with neurological deficits in the past. She denies any personal or family history of stroke. She is not a smoker. She denies drug use. She reports that her headache is sharp and located in the temporal region on the left, constant, nonradiating. Last seen normal at 5:30PM  Past Medical History:  Diagnosis Date  . Migraine     Patient Active Problem List   Diagnosis Date Noted  . Stroke (HCC) 11/02/2016  . Migraines 11/02/2016    Past Surgical History:  Procedure Laterality Date  . CESAREAN SECTION    . CHOLECYSTECTOMY      Prior to Admission medications   Medication Sig Start Date End Date Taking? Authorizing Provider  cyclobenzaprine (FLEXERIL) 10 MG tablet Take 1 tablet (10 mg total) by mouth every 8 (eight) hours as needed for muscle spasms. Patient not taking: Reported on 11/02/2016 09/15/14   Governor RooksLord, Rebecca, MD  HYDROcodone-acetaminophen (NORCO/VICODIN) 5-325 MG per tablet Take 1 tablet by mouth every 6 (six) hours as needed for moderate pain. Patient not taking: Reported on 11/02/2016 09/15/14   Governor RooksLord, Rebecca, MD    Allergies Patient has no known allergies.  No family history on file.  Social History Social History  Substance Use Topics  . Smoking status: Former Games developermoker  . Smokeless tobacco: Never  Used  . Alcohol use Yes    Review of Systems  Constitutional: Negative for fever. Eyes: Negative for visual changes. ENT: Negative for sore throat. Neck: No neck pain  Cardiovascular: Negative for chest pain. Respiratory: Negative for shortness of breath. Gastrointestinal: Negative for abdominal pain, vomiting or diarrhea. Genitourinary: Negative for dysuria. Musculoskeletal: Negative for back pain. Skin: Negative for rash. Neurological: + HA and L sided weakness and numbness Psych: No SI or HI  ____________________________________________   PHYSICAL EXAM:  VITAL SIGNS: ED Triage Vitals  Enc Vitals Group     BP 11/02/16 1802 (!) 153/97     Pulse Rate 11/02/16 1802 (!) 102     Resp 11/02/16 1802 (!) 22     Temp 11/02/16 1802 98 F (36.7 C)     Temp Source 11/02/16 1802 Oral     SpO2 11/02/16 1802 97 %     Weight 11/02/16 1804 270 lb (122.5 kg)     Height 11/02/16 1804 5\' 7"  (1.702 m)     Head Circumference --      Peak Flow --      Pain Score 11/02/16 1803 10     Pain Loc --      Pain Edu? --      Excl. in GC? --     Constitutional: Alert and oriented. Well appearing and in no apparent distress. HEENT:      Head: Normocephalic and atraumatic.         Eyes: Conjunctivae are normal. Sclera is non-icteric. EOMI, PERRL      Mouth/Throat: Mucous  membranes are moist.       Neck: Supple with no signs of meningismus. No carotid bruit Cardiovascular: Regular rate and rhythm. No murmurs, gallops, or rubs. 2+ symmetrical distal pulses are present in all extremities. No JVD. Respiratory: Normal respiratory effort. Lungs are clear to auscultation bilaterally. No wheezes, crackles, or rhonchi.  Gastrointestinal: Soft, non tender, and non distended with positive bowel sounds. No rebound or guarding. Musculoskeletal: Nontender with normal range of motion in all extremities. No edema, cyanosis, or erythema of extremities. Neurologic: Normal speech and language. A & O x3, PERRL, no  nystagmus, CN II-XII intact, motor testing reveals good tone and bulk throughout. RU pronator drift, RU dysmetria to FNF. Muscle strength is 5/5 throughout on the R and 4+/5 on the LU and LL. Deep tendon reflexes are 2+ throughout with downgoing toes. Sensory is diminished on the L face, LU and LLE with no extinction. Gait deferred Skin: Skin is warm, dry and intact. No rash noted. Psychiatric: Mood and affect are normal. Speech and behavior are normal.   NIH Stroke Scale  Interval: Baseline Time: 11:22 PM Person Administering Scale: [place]  Administer stroke scale items in the order listed. Record performance in each category after each subscale exam. Do not go back and change scores. Follow directions provided for each exam technique. Scores should reflect what the patient does, not what the clinician thinks the patient can do. The clinician should record answers while administering the exam and work quickly. Except where indicated, the patient should not be coached (i.e., repeated requests to patient to make a special effort).   1a  Level of consciousness: 0=alert; keenly responsive  1b. LOC questions:  0=Performs both tasks correctly  1c. LOC commands: 0=Performs both tasks correctly  2.  Best Gaze: 0=normal  3.  Visual: 0=No visual loss  4. Facial Palsy: 0=Normal symmetric movement  5a.  Motor left arm: 1=Drift, limb holds 90 (or 45) degrees but drifts down before full 10 seconds: does not hit bed  5b.  Motor right arm: 0=No drift, limb holds 90 (or 45) degrees for full 10 seconds  6a. motor left leg: 1=Drift, limb holds 90 (or 45) degrees but drifts down before full 10 seconds: does not hit bed  6b  Motor right leg:  0=No drift, limb holds 90 (or 45) degrees for full 10 seconds  7. Limb Ataxia: 1=Present in one limb  8.  Sensory: 1=Mild to moderate sensory loss; patient feels pinprick is less sharp or is dull on the affected side; there is a loss of superficial pain with  pinprick but patient is aware She is being touched  9. Best Language:  0=No aphasia, normal  10. Dysarthria: 0=Normal  11. Extinction and Inattention: 0=No abnormality   Total:   4    ____________________________________________   LABS (all labs ordered are listed, but only abnormal results are displayed)  Labs Reviewed  CBC - Abnormal; Notable for the following:       Result Value   WBC 13.4 (*)    All other components within normal limits  DIFFERENTIAL - Abnormal; Notable for the following:    Neutro Abs 7.3 (*)    Lymphs Abs 4.5 (*)    Monocytes Absolute 1.0 (*)    Basophils Absolute 0.2 (*)    All other components within normal limits  COMPREHENSIVE METABOLIC PANEL - Abnormal; Notable for the following:    Glucose, Bld 103 (*)    All other components within normal limits  GLUCOSE, CAPILLARY - Abnormal; Notable for the following:    Glucose-Capillary 107 (*)    All other components within normal limits  PROTIME-INR  APTT  TROPONIN I  HCG, QUANTITATIVE, PREGNANCY  CBG MONITORING, ED   ____________________________________________  EKG  ED ECG REPORT I, Nita Sickle, the attending physician, personally viewed and interpreted this ECG.  Normal sinus rhythm, rate of 87, normal intervals, normal axis, no ST elevations or depressions. ____________________________________________  RADIOLOGY  Head CT: negative   MRI/MRA/MRV head and neck:   1. No acute intracranial process identified. 2. Few patchy T2/FLAIR hyperintense foci involving the periventricular and deep white matter of the left cerebral hemisphere as above, somewhat advanced for age. These foci are nonspecific. Primary differential considerations include accelerated chronic small vessel ischemic disease, sequelae of migrainous disorder, vasculitis, or other prior infectious or inflammatory process. Although not classic for demyelinating disease, this is not entirely excluded, and could be considered  in the correct clinical setting. 3. 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 4. Otherwise normal brain MRI. MRA HEAD IMPRESSION:  Normal intracranial MRA.  MRV HEAD IMPRESSION:  Normal intracranial MRV.  MRA NECK IMPRESSION:  Normal MRA of the neck. ____________________________________________   PROCEDURES  Procedure(s) performed: None Procedures Critical Care performed:  None ____________________________________________   INITIAL IMPRESSION / ASSESSMENT AND PLAN / ED COURSE  36 y.o. female history migraine headaches who presents for evaluation of headache, photophobia, and left-sided weakness/ numbness. Last seen normal 1 hour PTA at 5:30 PM. Code stroke called. Head CT negative. NIHSS 4. Patient evaluated by Neurology who believes this is most likely a complex migraine headache however he offered TPA to the patient who refused. I will perform MRI and MRA of the head and neck and MRV to rule out acute stroke, central venous thrombosis. Patient with no thunderclap headache and with CT scan done with less than 6 hours of onset of her symptoms therefore subarachnoid is considered less likely. We'll treat her migraine with Reglan, Toradol and fluids.    _________________________ 11:12 PM on 11/02/2016 -----------------------------------------  MRI showing chronic infarcts and patchy white matter disease. HA has resolved and patient's symptoms are persistent. Patient will be admitted for further evaluation  Pertinent labs & imaging results that were available during my care of the patient were reviewed by me and considered in my medical decision making (see chart for details).    ____________________________________________   FINAL CLINICAL IMPRESSION(S) / ED DIAGNOSES  Final diagnoses:  Left-sided weakness  Cerebrovascular accident (CVA), unspecified mechanism (HCC)      NEW  MEDICATIONS STARTED DURING THIS VISIT:  New Prescriptions   No medications on file     Note:  This document was prepared using Dragon voice recognition software and may include unintentional dictation errors.     Nita Sickle, MD 11/02/16 2322

## 2016-11-02 NOTE — Progress Notes (Signed)
CH received a Pg code stroke. Arriving at ED room 7   11/02/16 1900  Clinical Encounter Type  Visited With Patient and family together  Visit Type Code  Referral From Nurse  Consult/Referral To Chaplain  Spiritual Encounters  Spiritual Needs Prayer;Emotional   and found PT alone waiting a MRI. CH prayed for PT.

## 2016-11-02 NOTE — ED Notes (Signed)
Family at bedside. 

## 2016-11-02 NOTE — ED Notes (Addendum)
Pt presents with 1 hour of numbness to L hand, heavy feeling to L arm and leg. States HA at L temporal, blurred vision in L eye, states sharp pain to head. Sensory "dull" on L side. Pt is alert, oriented, speaking in complete sentences. Pt denies hx of stroke for her or family members. Pt states she does get headaches. Pt appears anxious, tearful. Provided tissues.

## 2016-11-02 NOTE — ED Notes (Signed)
MRI screening pt on phone

## 2016-11-02 NOTE — ED Notes (Addendum)
Pt denies smoking, denies taking contraceptives.  [facility] and states that she felt she was leaning to R side, states she felt like she couldn't bear weight on L side while sitting.  Pt moved to sitting position on own.

## 2016-11-03 ENCOUNTER — Observation Stay: Payer: Medicaid Other

## 2016-11-03 ENCOUNTER — Observation Stay
Admit: 2016-11-03 | Discharge: 2016-11-03 | Disposition: A | Discharge status: Home / Self Care | Payer: Medicaid Other | Attending: Internal Medicine | Admitting: Internal Medicine

## 2016-11-03 DIAGNOSIS — R531 Weakness: Secondary | ICD-10-CM

## 2016-11-03 LAB — LIPID PANEL
CHOL/HDL RATIO: 4 ratio
CHOLESTEROL: 140 mg/dL (ref 0–200)
HDL: 35 mg/dL — AB (ref >40)
LDL Cholesterol: 98 mg/dL (ref 0–99)
TRIGLYCERIDES: 36 mg/dL (ref <150)
VLDL: 7 mg/dL (ref 0–40)

## 2016-11-03 LAB — CBC
HEMATOCRIT: 35.3 % (ref 35.0–47.0)
HEMOGLOBIN: 12.5 g/dL (ref 12.0–16.0)
MCH: 33 pg (ref 26.0–34.0)
MCHC: 35.5 g/dL (ref 32.0–36.0)
MCV: 93 fL (ref 80.0–100.0)
Platelets: 277 10*3/uL (ref 150–440)
RBC: 3.79 MIL/uL — ABNORMAL LOW (ref 3.80–5.20)
RDW: 13 % (ref 11.5–14.5)
WBC: 9.3 10*3/uL (ref 3.6–11.0)

## 2016-11-03 LAB — SEDIMENTATION RATE: SED RATE: 45 mm/h — AB (ref 0–20)

## 2016-11-03 LAB — C-REACTIVE PROTEIN: CRP: 1.5 mg/dL — ABNORMAL HIGH (ref <1.0)

## 2016-11-03 LAB — CREATININE, SERUM
Creatinine, Ser: 0.75 mg/dL (ref 0.44–1.00)
GFR calc Af Amer: >60 mL/min (ref >60)
GFR calc non Af Amer: >60 mL/min (ref >60)

## 2016-11-03 MED ORDER — DEXAMETHASONE SODIUM PHOSPHATE 4 MG/ML IJ SOLN
4.0000 mg | Freq: Once | INTRAMUSCULAR | Status: AC
  Administered 2016-11-03: 12:00:00 4 mg via INTRAVENOUS
  Filled 2016-11-03: qty 1

## 2016-11-03 MED ORDER — HYDROMORPHONE BOLUS VIA INFUSION
1.0000 mg | Freq: Once | INTRAVENOUS | Status: DC
  Filled 2016-11-03: qty 1

## 2016-11-03 MED ORDER — PROMETHAZINE HCL 25 MG/ML IJ SOLN: 25.0000 mg | Freq: Four times a day (QID) | INTRAMUSCULAR | Status: DC | PRN

## 2016-11-03 MED ORDER — BUTALBITAL-APAP-CAFFEINE 50-325-40 MG PO TABS
1.0000 | ORAL_TABLET | Freq: Four times a day (QID) | ORAL | Status: DC | PRN
  Administered 2016-11-03 (×2): 1 via ORAL
  Filled 2016-11-03 (×6): qty 1

## 2016-11-03 MED ORDER — OXYCODONE HCL 5 MG PO TABS
5.0000 mg | ORAL_TABLET | ORAL | Status: DC | PRN
  Administered 2016-11-03 – 2016-11-04 (×4): 5 mg via ORAL
  Filled 2016-11-03 (×4): qty 1

## 2016-11-03 MED ORDER — ACETAMINOPHEN 650 MG RE SUPP: 650.0000 mg | RECTAL | Status: DC | PRN

## 2016-11-03 MED ORDER — ONDANSETRON HCL 4 MG/2ML IJ SOLN
4.0000 mg | Freq: Once | INTRAMUSCULAR | Status: AC
  Administered 2016-11-03: 13:00:00 4 mg via INTRAVENOUS
  Filled 2016-11-03: qty 2

## 2016-11-03 MED ORDER — ACETAMINOPHEN 160 MG/5ML PO SOLN
650.0000 mg | ORAL | Status: DC | PRN
  Filled 2016-11-03: qty 20.3

## 2016-11-03 MED ORDER — DIPHENHYDRAMINE HCL 25 MG PO CAPS
25.0000 mg | ORAL_CAPSULE | Freq: Once | ORAL | Status: DC
Start: 2016-11-03 — End: 2016-11-04

## 2016-11-03 MED ORDER — STROKE: EARLY STAGES OF RECOVERY BOOK
Freq: Once | Status: AC
  Administered 2016-11-03: 02:00:00

## 2016-11-03 MED ORDER — ACETAMINOPHEN 325 MG PO TABS: 650.0000 mg | ORAL_TABLET | ORAL | Status: DC | PRN

## 2016-11-03 MED ORDER — HYDROMORPHONE HCL 1 MG/ML IJ SOLN
1.0000 mg | Freq: Once | INTRAMUSCULAR | Status: AC
  Administered 2016-11-03: 1 mg via INTRAVENOUS
  Filled 2016-11-03: qty 1

## 2016-11-03 MED ORDER — ENOXAPARIN SODIUM 40 MG/0.4ML ~~LOC~~ SOLN
40.0000 mg | Freq: Two times a day (BID) | SUBCUTANEOUS | Status: DC
  Administered 2016-11-03 – 2016-11-04 (×4): 40 mg via SUBCUTANEOUS
  Filled 2016-11-03 (×4): qty 0.4

## 2016-11-03 NOTE — Progress Notes (Signed)
[employer] [facility] [place] [facility]   PATIENT NAME: Denise Peterson    MR#:  [id number]  DATE OF BIRTH:  April 15, 1980  SUBJECTIVE:  CHIEF COMPLAINT:   Chief Complaint  Patient presents with  . Code Stroke    Came with headache and left sided body numbness and weakness. MRI brain, MRA and MRV are negative. Still headache persist, and her c/o numbness and heaviness of left side.  REVIEW OF SYSTEMS:  CONSTITUTIONAL: No fever, fatigue or weakness.  EYES: No blurred or double vision.  EARS, NOSE, AND THROAT: No tinnitus or ear pain.  RESPIRATORY: No cough, shortness of breath, wheezing or hemoptysis.  CARDIOVASCULAR: No chest pain, orthopnea, edema.  GASTROINTESTINAL: No nausea, vomiting, diarrhea or abdominal pain.  GENITOURINARY: No dysuria, hematuria.  ENDOCRINE: No polyuria, nocturia,  HEMATOLOGY: No anemia, easy bruising or bleeding SKIN: No rash or lesion. MUSCULOSKELETAL: No joint pain or arthritis.   NEUROLOGIC: No tingling, left sided numbness, weakness.  PSYCHIATRY: No anxiety or depression.   ROS  DRUG ALLERGIES:  No Known Allergies  VITALS:  Blood pressure 108/68, pulse 64, temperature 98.1 F (36.7 C), temperature source Oral, resp. rate 14, height 5\' 7"  (1.702 m), weight 126.1 kg (278 lb), last menstrual period 10/21/2016, SpO2 96 %.  PHYSICAL EXAMINATION:  GENERAL:  36 y.o.-year-old patient lying in the bed with no acute distress.  EYES: Pupils equal, round, reactive to light and accommodation. No scleral icterus. Extraocular muscles intact.  HEENT: Head atraumatic, normocephalic. Oropharynx and nasopharynx clear.  NECK:  Supple, no jugular venous distention. No thyroid enlargement, no tenderness.  LUNGS: Normal breath sounds bilaterally, no wheezing, rales,rhonchi or crepitation. No use of accessory muscles of respiration.  CARDIOVASCULAR: S1, S2 normal. No murmurs, rubs, or gallops.  ABDOMEN: Soft, nontender, nondistended. Bowel sounds  present. No organomegaly or mass.  EXTREMITIES: No pedal edema, cyanosis, or clubbing.  NEUROLOGIC: Cranial nerves II through XII are intact. Muscle strength 5/5 in right sided extremities and 4/5 on left side upper and lower extremities. Sensation intact. Gait not checked.  PSYCHIATRIC: The patient is alert and oriented x 3.  SKIN: No obvious rash, lesion, or ulcer.   Physical Exam LABORATORY PANEL:   CBC  Recent Labs Lab 11/03/16 0126  WBC 9.3  HGB 12.5  HCT 35.3  PLT 277   ------------------------------------------------------------------------------------------------------------------  Chemistries   Recent Labs Lab 11/02/16 1816 11/03/16 0126  NA 138  --   K 3.7  --   CL 104  --   CO2 25  --   GLUCOSE 103*  --   BUN 9  --   CREATININE 0.80 0.75  CALCIUM 9.2  --   AST 28  --   ALT 28  --   ALKPHOS 59  --   BILITOT 0.4  --    ------------------------------------------------------------------------------------------------------------------  Cardiac Enzymes  Recent Labs Lab 11/02/16 1816  TROPONINI <0.03   ------------------------------------------------------------------------------------------------------------------  RADIOLOGY:  Dg Chest 2 View  Result Date: 11/03/2016 CLINICAL DATA:  36 year old female with left side weakness since yesterday. EXAM: CHEST  2 VIEW COMPARISON:  Brain MRI 11/02/2016. FINDINGS: Low normal lung volumes. Normal cardiac size and mediastinal contours. Visualized tracheal air column is within normal limits. No pneumothorax, pulmonary edema, pleural effusion or confluent pulmonary opacity. Cholecystectomy clips in the right upper quadrant. Negative visible bowel gas pattern. No acute osseous abnormality identified. IMPRESSION: Negative.  No acute cardiopulmonary abnormality. Electronically Signed   By: Odessa Fleming M.D.   On: 11/03/2016 08:02  Mr Shirlee LatchMra Head ZOWo Contrast  Result Date: 11/02/2016 CLINICAL DATA:  Initial evaluation for  left-sided weakness and numbness with intermittent left-sided visual loss, left-sided headache. EXAM: MRI HEAD WITHOUT CONTRAST MRA HEAD WITHOUT CONTRAST MRA NECK WITHOUT AND WITH CONTRAST MRV HEAD WITHOUT CONTRAST TECHNIQUE: Multiplanar, multiecho pulse sequences of the brain and surrounding structures were obtained without intravenous contrast. Angiographic images of the Circle of Willis were obtained using MRA technique without intravenous contrast. Angiographic images of the neck were obtained using MRA technique without and with intravenous contrast. Carotid stenosis measurements (when applicable) are obtained utilizing NASCET criteria, using the distal internal carotid diameter as the denominator. CONTRAST:  20mL MULTIHANCE GADOBENATE DIMEGLUMINE 529 MG/ML IV SOLN COMPARISON:  Prior CT from earlier the same day. FINDINGS: MRI HEAD FINDINGS Cerebral volume within normal limits for patient age. Few scattered T2/FLAIR hyperintense foci noted within the periventricular and deep white matter of the left cerebral hemisphere. Most prominent of these foci in noted within the left periatrial white matter node and measures 1 cm (series 8, image 14). These foci are nonspecific. Two adjacent foci of encephalomalacia within the inferior left cerebellar hemisphere suspected to be related to remote ischemia (series 7, image 6). No abnormal foci of restricted diffusion to suggest acute or subacute ischemia. Gray-white matter differentiation otherwise maintained. No other evidence for chronic infarction. Single tiny foci of susceptibility artifact within the mid pons noted, consistent with a small chronic microhemorrhage (series 9, image 9). No other evidence for acute or chronic intracranial hemorrhage. No mass lesion, midline shift, or mass effect. No hydrocephalus. No extra-axial fluid collection. Major dural sinuses are grossly patent. Incidental note made of a partially empty sella. Pituitary gland suprasellar region  otherwise unremarkable. Midline structures intact and normal. Major intracranial vascular flow voids are maintained. Craniocervical junction within normal limits. Visualized upper cervical spine unremarkable. Bone marrow signal intensity within normal limits. No scalp soft tissue abnormality. Globes and orbital soft tissues within normal limits. Paranasal sinuses are clear. No mastoid effusion. Inner ear structures grossly normal. MRA HEAD FINDINGS ANTERIOR CIRCULATION: Distal cervical segments of the internal carotid arteries are widely patent with antegrade flow. Petrous, cavernous, and supraclinoid segments widely patent bilaterally without stenosis. Origin of the ophthalmic arteries patent. ICA termini widely patent. A1 segments widely patent. Anterior cerebral arteries widely patent to their distal aspects. M1 segments patent without stenosis or occlusion. MCA bifurcations normal. No proximal M2 occlusion. Distal MCA branches well opacified and symmetric parent POSTERIOR CIRCULATION: Vertebral artery's patent to the vertebrobasilar junction without stenosis. Posterior inferior cerebral arteries patent proximally. Basilar artery widely patent. Superior cerebral arteries patent bilaterally. Both of the posterior cerebral arteries primarily supplied via the basilar and are widely patent to their distal aspects. Small bilateral posterior communicating arteries noted. No aneurysm or vascular malformation. MRV HEAD FINDINGS: The superior sagittal sinus widely patent. Torcula widely patent. Right transverse and sigmoid sinuses are widely patent and dominant. Proximal right internal jugular vein patent. Left transverse and sigmoid sinuses are hypoplastic but patent as well. Proximal left internal jugular vein patent. No obvious abrupt cut off to suggest dural sinus stenosis. Straight sinus, vein of Galen, and internal cerebral veins patent bilaterally. No obvious cortical vein thrombosis. The MRA NECK FINDINGS Source  images reviewed. Visualized aortic arch of normal caliber with normal branch pattern. No high-grade flow-limiting stenosis about the origin of the great vessels. Visualized subclavian arteries widely patent. Right common and internal carotid artery's are widely patent from their origins to the skullbase  without stenosis or occlusion. No significant atheromatous irregularity or narrowing about the right carotid bifurcation. Left common and internal carotid arteries are widely patent from their origins to the skullbase without stenosis or occlusion. No significant atheromatous irregularity or narrowing about the left carotid bifurcation. Both of the vertebral arteries arise from the subclavian arteries. Vertebral arteries are largely code dominant. Vertebral arteries widely patent within the neck without stenosis or occlusion. IMPRESSION: MRI HEAD IMPRESSION: 1. No acute intracranial process identified. 2. Few patchy T2/FLAIR hyperintense foci involving the periventricular and deep white matter of the left cerebral hemisphere as above, somewhat advanced for age. These foci are nonspecific. Primary differential considerations include accelerated chronic small vessel ischemic disease, sequelae of migrainous disorder, vasculitis, or other prior infectious or inflammatory process. Although not classic for demyelinating disease, this is not entirely excluded, and could be considered in the correct clinical setting. 3. 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 4. Otherwise normal brain MRI. MRA HEAD IMPRESSION: Normal intracranial MRA. MRV HEAD IMPRESSION: Normal intracranial MRV. MRA NECK IMPRESSION: Normal MRA of the neck. Electronically Signed   By: Rise Mu M.D.   On: 11/02/2016 22:41   Mr Angiogram Neck W Or Wo Contrast  Result Date: 11/02/2016 CLINICAL DATA:  Initial evaluation for left-sided weakness and  numbness with intermittent left-sided visual loss, left-sided headache. EXAM: MRI HEAD WITHOUT CONTRAST MRA HEAD WITHOUT CONTRAST MRA NECK WITHOUT AND WITH CONTRAST MRV HEAD WITHOUT CONTRAST TECHNIQUE: Multiplanar, multiecho pulse sequences of the brain and surrounding structures were obtained without intravenous contrast. Angiographic images of the Circle of Willis were obtained using MRA technique without intravenous contrast. Angiographic images of the neck were obtained using MRA technique without and with intravenous contrast. Carotid stenosis measurements (when applicable) are obtained utilizing NASCET criteria, using the distal internal carotid diameter as the denominator. CONTRAST:  20mL MULTIHANCE GADOBENATE DIMEGLUMINE 529 MG/ML IV SOLN COMPARISON:  Prior CT from earlier the same day. FINDINGS: MRI HEAD FINDINGS Cerebral volume within normal limits for patient age. Few scattered T2/FLAIR hyperintense foci noted within the periventricular and deep white matter of the left cerebral hemisphere. Most prominent of these foci in noted within the left periatrial white matter node and measures 1 cm (series 8, image 14). These foci are nonspecific. Two adjacent foci of encephalomalacia within the inferior left cerebellar hemisphere suspected to be related to remote ischemia (series 7, image 6). No abnormal foci of restricted diffusion to suggest acute or subacute ischemia. Gray-white matter differentiation otherwise maintained. No other evidence for chronic infarction. Single tiny foci of susceptibility artifact within the mid pons noted, consistent with a small chronic microhemorrhage (series 9, image 9). No other evidence for acute or chronic intracranial hemorrhage. No mass lesion, midline shift, or mass effect. No hydrocephalus. No extra-axial fluid collection. Major dural sinuses are grossly patent. Incidental note made of a partially empty sella. Pituitary gland suprasellar region otherwise unremarkable.  Midline structures intact and normal. Major intracranial vascular flow voids are maintained. Craniocervical junction within normal limits. Visualized upper cervical spine unremarkable. Bone marrow signal intensity within normal limits. No scalp soft tissue abnormality. Globes and orbital soft tissues within normal limits. Paranasal sinuses are clear. No mastoid effusion. Inner ear structures grossly normal. MRA HEAD FINDINGS ANTERIOR CIRCULATION: Distal cervical segments of the internal carotid arteries are widely patent with antegrade flow. Petrous, cavernous, and supraclinoid segments widely patent bilaterally without stenosis. Origin of the ophthalmic arteries patent. ICA  termini widely patent. A1 segments widely patent. Anterior cerebral arteries widely patent to their distal aspects. M1 segments patent without stenosis or occlusion. MCA bifurcations normal. No proximal M2 occlusion. Distal MCA branches well opacified and symmetric parent POSTERIOR CIRCULATION: Vertebral artery's patent to the vertebrobasilar junction without stenosis. Posterior inferior cerebral arteries patent proximally. Basilar artery widely patent. Superior cerebral arteries patent bilaterally. Both of the posterior cerebral arteries primarily supplied via the basilar and are widely patent to their distal aspects. Small bilateral posterior communicating arteries noted. No aneurysm or vascular malformation. MRV HEAD FINDINGS: The superior sagittal sinus widely patent. Torcula widely patent. Right transverse and sigmoid sinuses are widely patent and dominant. Proximal right internal jugular vein patent. Left transverse and sigmoid sinuses are hypoplastic but patent as well. Proximal left internal jugular vein patent. No obvious abrupt cut off to suggest dural sinus stenosis. Straight sinus, vein of Galen, and internal cerebral veins patent bilaterally. No obvious cortical vein thrombosis. The MRA NECK FINDINGS Source images reviewed.  Visualized aortic arch of normal caliber with normal branch pattern. No high-grade flow-limiting stenosis about the origin of the great vessels. Visualized subclavian arteries widely patent. Right common and internal carotid artery's are widely patent from their origins to the skullbase without stenosis or occlusion. No significant atheromatous irregularity or narrowing about the right carotid bifurcation. Left common and internal carotid arteries are widely patent from their origins to the skullbase without stenosis or occlusion. No significant atheromatous irregularity or narrowing about the left carotid bifurcation. Both of the vertebral arteries arise from the subclavian arteries. Vertebral arteries are largely code dominant. Vertebral arteries widely patent within the neck without stenosis or occlusion. IMPRESSION: MRI HEAD IMPRESSION: 1. No acute intracranial process identified. 2. Few patchy T2/FLAIR hyperintense foci involving the periventricular and deep white matter of the left cerebral hemisphere as above, somewhat advanced for age. These foci are nonspecific. Primary differential considerations include accelerated chronic small vessel ischemic disease, sequelae of migrainous disorder, vasculitis, or other prior infectious or inflammatory process. Although not classic for demyelinating disease, this is not entirely excluded, and could be considered in the correct clinical setting. 3. 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 4. Otherwise normal brain MRI. MRA HEAD IMPRESSION: Normal intracranial MRA. MRV HEAD IMPRESSION: Normal intracranial MRV. MRA NECK IMPRESSION: Normal MRA of the neck. Electronically Signed   By: Rise Mu M.D.   On: 11/02/2016 22:41   Mr Brain Wo Contrast  Result Date: 11/02/2016 CLINICAL DATA:  Initial evaluation for left-sided weakness and numbness with intermittent left-sided  visual loss, left-sided headache. EXAM: MRI HEAD WITHOUT CONTRAST MRA HEAD WITHOUT CONTRAST MRA NECK WITHOUT AND WITH CONTRAST MRV HEAD WITHOUT CONTRAST TECHNIQUE: Multiplanar, multiecho pulse sequences of the brain and surrounding structures were obtained without intravenous contrast. Angiographic images of the Circle of Willis were obtained using MRA technique without intravenous contrast. Angiographic images of the neck were obtained using MRA technique without and with intravenous contrast. Carotid stenosis measurements (when applicable) are obtained utilizing NASCET criteria, using the distal internal carotid diameter as the denominator. CONTRAST:  20mL MULTIHANCE GADOBENATE DIMEGLUMINE 529 MG/ML IV SOLN COMPARISON:  Prior CT from earlier the same day. FINDINGS: MRI HEAD FINDINGS Cerebral volume within normal limits for patient age. Few scattered T2/FLAIR hyperintense foci noted within the periventricular and deep white matter of the left cerebral hemisphere. Most prominent of these foci in noted within the left periatrial white matter node and  measures 1 cm (series 8, image 14). These foci are nonspecific. Two adjacent foci of encephalomalacia within the inferior left cerebellar hemisphere suspected to be related to remote ischemia (series 7, image 6). No abnormal foci of restricted diffusion to suggest acute or subacute ischemia. Gray-white matter differentiation otherwise maintained. No other evidence for chronic infarction. Single tiny foci of susceptibility artifact within the mid pons noted, consistent with a small chronic microhemorrhage (series 9, image 9). No other evidence for acute or chronic intracranial hemorrhage. No mass lesion, midline shift, or mass effect. No hydrocephalus. No extra-axial fluid collection. Major dural sinuses are grossly patent. Incidental note made of a partially empty sella. Pituitary gland suprasellar region otherwise unremarkable. Midline structures intact and normal.  Major intracranial vascular flow voids are maintained. Craniocervical junction within normal limits. Visualized upper cervical spine unremarkable. Bone marrow signal intensity within normal limits. No scalp soft tissue abnormality. Globes and orbital soft tissues within normal limits. Paranasal sinuses are clear. No mastoid effusion. Inner ear structures grossly normal. MRA HEAD FINDINGS ANTERIOR CIRCULATION: Distal cervical segments of the internal carotid arteries are widely patent with antegrade flow. Petrous, cavernous, and supraclinoid segments widely patent bilaterally without stenosis. Origin of the ophthalmic arteries patent. ICA termini widely patent. A1 segments widely patent. Anterior cerebral arteries widely patent to their distal aspects. M1 segments patent without stenosis or occlusion. MCA bifurcations normal. No proximal M2 occlusion. Distal MCA branches well opacified and symmetric parent POSTERIOR CIRCULATION: Vertebral artery's patent to the vertebrobasilar junction without stenosis. Posterior inferior cerebral arteries patent proximally. Basilar artery widely patent. Superior cerebral arteries patent bilaterally. Both of the posterior cerebral arteries primarily supplied via the basilar and are widely patent to their distal aspects. Small bilateral posterior communicating arteries noted. No aneurysm or vascular malformation. MRV HEAD FINDINGS: The superior sagittal sinus widely patent. Torcula widely patent. Right transverse and sigmoid sinuses are widely patent and dominant. Proximal right internal jugular vein patent. Left transverse and sigmoid sinuses are hypoplastic but patent as well. Proximal left internal jugular vein patent. No obvious abrupt cut off to suggest dural sinus stenosis. Straight sinus, vein of Galen, and internal cerebral veins patent bilaterally. No obvious cortical vein thrombosis. The MRA NECK FINDINGS Source images reviewed. Visualized aortic arch of normal caliber with  normal branch pattern. No high-grade flow-limiting stenosis about the origin of the great vessels. Visualized subclavian arteries widely patent. Right common and internal carotid artery's are widely patent from their origins to the skullbase without stenosis or occlusion. No significant atheromatous irregularity or narrowing about the right carotid bifurcation. Left common and internal carotid arteries are widely patent from their origins to the skullbase without stenosis or occlusion. No significant atheromatous irregularity or narrowing about the left carotid bifurcation. Both of the vertebral arteries arise from the subclavian arteries. Vertebral arteries are largely code dominant. Vertebral arteries widely patent within the neck without stenosis or occlusion. IMPRESSION: MRI HEAD IMPRESSION: 1. No acute intracranial process identified. 2. Few patchy T2/FLAIR hyperintense foci involving the periventricular and deep white matter of the left cerebral hemisphere as above, somewhat advanced for age. These foci are nonspecific. Primary differential considerations include accelerated chronic small vessel ischemic disease, sequelae of migrainous disorder, vasculitis, or other prior infectious or inflammatory process. Although not classic for demyelinating disease, this is not entirely excluded, and could be considered in the correct clinical setting. 3. 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 4. Otherwise normal brain  MRI. MRA HEAD IMPRESSION: Normal intracranial MRA. MRV HEAD IMPRESSION: Normal intracranial MRV. MRA NECK IMPRESSION: Normal MRA of the neck. Electronically Signed   By: Rise Mu M.D.   On: 11/02/2016 22:41   Mr Mrv Head Wo Cm  Result Date: 11/02/2016 CLINICAL DATA:  Initial evaluation for left-sided weakness and numbness with intermittent left-sided visual loss, left-sided headache. EXAM: MRI HEAD  WITHOUT CONTRAST MRA HEAD WITHOUT CONTRAST MRA NECK WITHOUT AND WITH CONTRAST MRV HEAD WITHOUT CONTRAST TECHNIQUE: Multiplanar, multiecho pulse sequences of the brain and surrounding structures were obtained without intravenous contrast. Angiographic images of the Circle of Willis were obtained using MRA technique without intravenous contrast. Angiographic images of the neck were obtained using MRA technique without and with intravenous contrast. Carotid stenosis measurements (when applicable) are obtained utilizing NASCET criteria, using the distal internal carotid diameter as the denominator. CONTRAST:  20mL MULTIHANCE GADOBENATE DIMEGLUMINE 529 MG/ML IV SOLN COMPARISON:  Prior CT from earlier the same day. FINDINGS: MRI HEAD FINDINGS Cerebral volume within normal limits for patient age. Few scattered T2/FLAIR hyperintense foci noted within the periventricular and deep white matter of the left cerebral hemisphere. Most prominent of these foci in noted within the left periatrial white matter node and measures 1 cm (series 8, image 14). These foci are nonspecific. Two adjacent foci of encephalomalacia within the inferior left cerebellar hemisphere suspected to be related to remote ischemia (series 7, image 6). No abnormal foci of restricted diffusion to suggest acute or subacute ischemia. Gray-white matter differentiation otherwise maintained. No other evidence for chronic infarction. Single tiny foci of susceptibility artifact within the mid pons noted, consistent with a small chronic microhemorrhage (series 9, image 9). No other evidence for acute or chronic intracranial hemorrhage. No mass lesion, midline shift, or mass effect. No hydrocephalus. No extra-axial fluid collection. Major dural sinuses are grossly patent. Incidental note made of a partially empty sella. Pituitary gland suprasellar region otherwise unremarkable. Midline structures intact and normal. Major intracranial vascular flow voids are  maintained. Craniocervical junction within normal limits. Visualized upper cervical spine unremarkable. Bone marrow signal intensity within normal limits. No scalp soft tissue abnormality. Globes and orbital soft tissues within normal limits. Paranasal sinuses are clear. No mastoid effusion. Inner ear structures grossly normal. MRA HEAD FINDINGS ANTERIOR CIRCULATION: Distal cervical segments of the internal carotid arteries are widely patent with antegrade flow. Petrous, cavernous, and supraclinoid segments widely patent bilaterally without stenosis. Origin of the ophthalmic arteries patent. ICA termini widely patent. A1 segments widely patent. Anterior cerebral arteries widely patent to their distal aspects. M1 segments patent without stenosis or occlusion. MCA bifurcations normal. No proximal M2 occlusion. Distal MCA branches well opacified and symmetric parent POSTERIOR CIRCULATION: Vertebral artery's patent to the vertebrobasilar junction without stenosis. Posterior inferior cerebral arteries patent proximally. Basilar artery widely patent. Superior cerebral arteries patent bilaterally. Both of the posterior cerebral arteries primarily supplied via the basilar and are widely patent to their distal aspects. Small bilateral posterior communicating arteries noted. No aneurysm or vascular malformation. MRV HEAD FINDINGS: The superior sagittal sinus widely patent. Torcula widely patent. Right transverse and sigmoid sinuses are widely patent and dominant. Proximal right internal jugular vein patent. Left transverse and sigmoid sinuses are hypoplastic but patent as well. Proximal left internal jugular vein patent. No obvious abrupt cut off to suggest dural sinus stenosis. Straight sinus, vein of Galen, and internal cerebral veins patent bilaterally. No obvious cortical vein thrombosis. The MRA NECK FINDINGS Source images reviewed. Visualized aortic arch of normal  caliber with normal branch pattern. No high-grade  flow-limiting stenosis about the origin of the great vessels. Visualized subclavian arteries widely patent. Right common and internal carotid artery's are widely patent from their origins to the skullbase without stenosis or occlusion. No significant atheromatous irregularity or narrowing about the right carotid bifurcation. Left common and internal carotid arteries are widely patent from their origins to the skullbase without stenosis or occlusion. No significant atheromatous irregularity or narrowing about the left carotid bifurcation. Both of the vertebral arteries arise from the subclavian arteries. Vertebral arteries are largely code dominant. Vertebral arteries widely patent within the neck without stenosis or occlusion. IMPRESSION: MRI HEAD IMPRESSION: 1. No acute intracranial process identified. 2. Few patchy T2/FLAIR hyperintense foci involving the periventricular and deep white matter of the left cerebral hemisphere as above, somewhat advanced for age. These foci are nonspecific. Primary differential considerations include accelerated chronic small vessel ischemic disease, sequelae of migrainous disorder, vasculitis, or other prior infectious or inflammatory process. Although not classic for demyelinating disease, this is not entirely excluded, and could be considered in the correct clinical setting. 3. 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 4. Otherwise normal brain MRI. MRA HEAD IMPRESSION: Normal intracranial MRA. MRV HEAD IMPRESSION: Normal intracranial MRV. MRA NECK IMPRESSION: Normal MRA of the neck. Electronically Signed   By: Rise Mu M.D.   On: 11/02/2016 22:41   Ct Head Code Stroke W/o Cm  Result Date: 11/02/2016 CLINICAL DATA:  Code stroke. Left-sided numbness. Onset 45 minutes prior to admission EXAM: CT HEAD WITHOUT CONTRAST TECHNIQUE: Contiguous axial images were obtained from the  base of the skull through the vertex without intravenous contrast. COMPARISON:  None. FINDINGS: Brain: No evidence of acute infarction, hemorrhage, hydrocephalus, extra-axial collection or mass lesion/mass effect. Vascular: No hyperdense vessel or unexpected calcification. Skull: Negative Sinuses/Orbits: Negative Other: None ASPECTS (Alberta Stroke Program Early CT Score) - Ganglionic level infarction (caudate, lentiform nuclei, internal capsule, insula, M1-M3 cortex): 7 - Supraganglionic infarction (M4-M6 cortex): 3 Total score (0-10 with 10 being normal): 10 IMPRESSION: 1. Negative CT Head 2. ASPECTS is 10 These results were called by telephone at the time of interpretation on 11/02/2016 at 6:20 pm to Dr. Roxan Hockey, who verbally acknowledged these results. Electronically Signed   By: Marlan Palau M.D.   On: 11/02/2016 18:21    ASSESSMENT AND PLAN:   Principal Problem:   Migraines Active Problems:   Left-sided weakness   *  complicated migraine  stroke suspected but ruled out  - Patient has an area of her cerebellum which shows potential old lesion, though this is in the setting of a normal MRA. MRI does not show acute stroke, and certainly does not show stroke in any region that would correspond to her current symptoms.  Appreciated a neurology consult, and try and treat her headache, which could be an atypical migraine with neurologic manifestations, as below.  *  Migraines - patient received treatment with fioricet, given one dose dilaudid- but had nausea. Started on oxycodone. And given one dose benadryl.    All the records are reviewed and case discussed with Care Management/Social Workerr. Management plans discussed with the patient, family and they are in agreement.  CODE STATUS: full.  TOTAL TIME TAKING CARE OF THIS PATIENT: 35 minutes.     POSSIBLE D/C IN 1-2 DAYS, DEPENDING ON CLINICAL CONDITION.   Altamese Dilling M.D on 11/03/2016   Between 7am to 6pm -  Pager -  [phone]  After 6pm go to [email or web address] - password [email or web address]  [facility]  Office  [phone]  CC: Primary care physician; Patient, No Pcp Per  Note: This dictation was prepared with Dragon dictation along with smaller phrase technology. Any transcriptional errors that result from this process are unintentional.

## 2016-11-03 NOTE — Evaluation (Signed)
Physical Therapy Evaluation Patient Details Name: Denise Peterson MRN: [id number] DOB: [birth year] Today's Date: 11/03/2016   History of Present Illness  36 y/o female here with migrains and L sided (total from mid line) weakness, coordination and sensory issues.  She reports that symptoms seemed to call down some initially, but during eval she again had head aches increased "heaviness" in L limbs.  Pt is currently has increased stress with moving and working through a divorce.  Clinical Impression  Pt is very eager to work with PT and showed good effort t/o the initial exam.  Pt's biggest repeated concern in the heaviness in her L side (U&LEs).  Pt was very slow, deliberate with coordination testing on the L and was hesitant with ambulation.  She was able to ambulate 300 ft and negotiate steps but needed encouragement, cuing and occasional reinforcement.  Pt overall did well but is concerned by her continued headache and lack of resolution/decrease in symptoms.     Follow Up Recommendations Outpatient PT    Equipment Recommendations  None recommended by PT    Recommendations for Other Services       Precautions / Restrictions Precautions Precautions: Fall Restrictions Weight Bearing Restrictions: No      Mobility  Bed Mobility Overal bed mobility: Independent             General bed mobility comments: Pt able to get to sitting EOB w/o assist, is more deliberate (and slower) than normal  Transfers Overall transfer level: Independent Equipment used: None             General transfer comment: Safe and independent with transition to standing, but not at all near her baseline.  Ambulation/Gait Ambulation/Gait assistance: Supervision Ambulation Distance (Feet): 300 Feet Assistive device: None       General Gait Details: Pt with slow, guarded, deliberate ambulation but no LOBs or significant safety issues.  She had some issues with L toe clearance and needed to  focus heavily on that.  Pt not at all near her baseline, but functional.  Stairs Stairs: Yes Stairs assistance: Supervision Stair Management: Step to pattern (used wall, no rails) Number of Stairs: 15 General stair comments: Pt able to negotiate up/down steps with slow, deliberate strategy.  She showed good effort and motivation and overall was safe but hesitant.   Wheelchair Mobility    Modified Rankin (Stroke Patients Only)       Balance Overall balance assessment: Modified Independent (Pt had decreased confidence, but did not have LOBs)                                           Pertinent Vitals/Pain Pain Assessment:  (L sided head ache pain)    Home Living Family/patient expects to be discharged to:: Private residence Living Arrangements: Children;Spouse/significant other Available Help at Discharge: Family;Friend(s)   Home Access: Level entry     Home Layout: Bed/bath upstairs (in home she is moving to)   Additional Comments: Pt currently moving out of home she shares with soon-to-be ex-husband (currently going through divorce)    Prior Function Level of Independence: Independent         Comments: Pt works full time, regularly exercises, etc     Hand Dominance        Extremity/Trunk Assessment   Upper Extremity Assessment Upper Extremity Assessment: LUE deficits/detail (R UE WNL) LUE Deficits /  Details: grossly 3+/5, poor speed and quality of movement LUE Sensation:  (decreased sensation, feels pressure only)    Lower Extremity Assessment Lower Extremity Assessment: LLE deficits/detail (R LE WNL) LLE Deficits / Details: grossly 3+/5, poor speed and quality of movement LLE Sensation:  (sensation decresed, only feels pressure)       Communication   Communication: No difficulties  Cognition Arousal/Alertness: Awake/alert Behavior During Therapy: WFL for tasks assessed/performed Overall Cognitive Status: Within Functional Limits  for tasks assessed                                        General Comments      Exercises     Assessment/Plan    PT Assessment Patient needs continued PT services  PT Problem List Decreased strength;Decreased activity tolerance;Decreased balance;Decreased mobility;Decreased coordination;Decreased knowledge of use of DME;Decreased safety awareness;Impaired sensation       PT Treatment Interventions Gait training;Stair training;Therapeutic activities;Functional mobility training;Therapeutic exercise;Balance training;Cognitive remediation;Patient/family education    PT Goals (Current goals can be found in the Care Plan section)  Acute Rehab PT Goals Patient Stated Goal: Get strength back to be able to continue her move and work PT Goal Formulation: With patient Time For Goal Achievement: 11/17/16 Potential to Achieve Goals: Good    Frequency 7X/week   Barriers to discharge        Co-evaluation               AM-PAC PT "6 Clicks" Daily Activity  Outcome Measure Difficulty turning over in bed (including adjusting bedclothes, sheets and blankets)?: None Difficulty moving from lying on back to sitting on the side of the bed? : None Difficulty sitting down on and standing up from a chair with arms (e.g., wheelchair, bedside commode, etc,.)?: None Help needed moving to and from a bed to chair (including a wheelchair)?: None Help needed walking in hospital room?: None Help needed climbing 3-5 steps with a railing? : None 6 Click Score: 24    End of Session Equipment Utilized During Treatment: Gait belt Activity Tolerance: Patient tolerated treatment well Patient left: with bed alarm set;with call bell/phone within reach   PT Visit Diagnosis: Muscle weakness (generalized) (M62.81);Difficulty in walking, not elsewhere classified (R26.2)    Time: [id number] PT Time Calculation (min) (ACUTE ONLY): 35 min   Charges:   PT Evaluation $PT Eval Moderate  Complexity: 1 Mod PT Treatments $Gait Training: 8-22 mins   PT G Codes:   PT G-Codes **NOT FOR INPATIENT CLASS** Functional Assessment Tool Used: AM-PAC 6 Clicks Basic Mobility Functional Limitation: Mobility: Walking and moving around Mobility: Walking and Moving Around Current Status ([id number]([id number]): At least 1 percent but less than 20 percent impaired, limited or restricted Mobility: Walking and Moving Around Goal Status [phone]([id number]): 0 percent impaired, limited or restricted    Malachi ProGalen R Tahmid Stonehocker, DPT 11/03/2016, 10:39 AM

## 2016-11-03 NOTE — Progress Notes (Signed)
Pharmacy dose adjustment for lovenox. Patient admitted for L sided weakness found to have normal MRI/MRA of head/neck.  DVT prophylaxis ordered as lovenox 40 mg subq daily. Pt's BMI > 40. Will readjust to lovenox 40 mg subq twice daily.  Thomasene Rippleavid Malorie Bigford, PharmD, BCPS Clinical Pharmacist 11/03/2016

## 2016-11-03 NOTE — Consult Note (Signed)
Reason for Consult:Headache and left sided weakness Referring Physician: Anselm Jungling  CC: Headache and left sided weakness  HPI: Denise Peterson is an 36 y.o. female with a history of migraines who reports that on the day of presentation she has the acute onset of a left sided sharp headache.  Her vision became blurry and she began to experience numbness along the entire left side including the face.  Her left side became heavy as well.  She presented for evaluation at that time.  In the ED the patient was evaluated by teleneurology but refused tPA.  Today headache continues at a 6/10 and has travelled from the temporal area to the occiput.  Left side remains numb and heavy.    Past Medical History:  Diagnosis Date  . Migraine     Past Surgical History:  Procedure Laterality Date  . CESAREAN SECTION    . CHOLECYSTECTOMY      Family history: Mother and sister alive and well.    Social History:  reports that she has quit smoking. She has never used smokeless tobacco. She reports that she drinks alcohol. She reports that she does not use drugs.  No Known Allergies  Medications:  I have reviewed the patient's current medications. Prior to Admission:  Prescriptions Prior to Admission  Medication Sig Dispense Refill Last Dose  . cyclobenzaprine (FLEXERIL) 10 MG tablet Take 1 tablet (10 mg total) by mouth every 8 (eight) hours as needed for muscle spasms. (Patient not taking: Reported on 11/02/2016) 20 tablet 0 Not Taking at Unknown time  . HYDROcodone-acetaminophen (NORCO/VICODIN) 5-325 MG per tablet Take 1 tablet by mouth every 6 (six) hours as needed for moderate pain. (Patient not taking: Reported on 11/02/2016) 10 tablet 0 Not Taking at Unknown time   Scheduled: . diphenhydrAMINE  25 mg Oral Once  . enoxaparin (LOVENOX) injection  40 mg Subcutaneous BID    ROS: History obtained from the patient  General ROS: negative for - chills, fatigue, fever, night sweats, weight gain or  weight loss Psychological ROS: negative for - behavioral disorder, hallucinations, memory difficulties, mood swings or suicidal ideation Ophthalmic ROS: negative for - blurry vision, double vision, eye pain or loss of vision ENT ROS: negative for - epistaxis, nasal discharge, oral lesions, sore throat, tinnitus or vertigo Allergy and Immunology ROS: negative for - hives or itchy/watery eyes Hematological and Lymphatic ROS: negative for - bleeding problems, bruising or swollen lymph nodes Endocrine ROS: negative for - galactorrhea, hair pattern changes, polydipsia/polyuria or temperature intolerance Respiratory ROS: negative for - cough, hemoptysis, shortness of breath or wheezing Cardiovascular ROS: negative for - chest pain, dyspnea on exertion, edema or irregular heartbeat Gastrointestinal ROS: negative for - abdominal pain, diarrhea, hematemesis, nausea/vomiting or stool incontinence Genito-Urinary ROS: negative for - dysuria, hematuria, incontinence or urinary frequency/urgency Musculoskeletal ROS: negative for - joint swelling or muscular weakness Neurological ROS: as noted in HPI Dermatological ROS: negative for rash and skin lesion changes  Physical Examination: Blood pressure (!) 113/58, pulse 63, temperature 97.8 F (36.6 C), temperature source Oral, resp. rate 18, height '5\' 7"'  (1.702 m), weight 126.1 kg (278 lb), last menstrual period 10/21/2016, SpO2 97 %.  HEENT-  Normocephalic, no lesions, without obvious abnormality.  Normal external eye and conjunctiva.  Normal TM's bilaterally.  Normal auditory canals and external ears. Normal external nose, mucus membranes and septum.  Normal pharynx. Cardiovascular- S1, S2 normal, pulses palpable throughout   Lungs- chest clear, no wheezing, rales, normal symmetric air entry Abdomen-  soft, non-tender; bowel sounds normal; no masses,  no organomegaly Extremities- no edema Lymph-no adenopathy palpable Musculoskeletal-no joint tenderness,  deformity or swelling Skin-warm and dry, no hyperpigmentation, vitiligo, or suspicious lesions  Neurological Examination   Mental Status: Alert, oriented, thought content appropriate.  Speech fluent without evidence of aphasia.  Able to follow 3 step commands without difficulty. Cranial Nerves: II: Discs flat bilaterally; Visual fields grossly normal, pupils equal, round, reactive to light and accommodation III,IV, VI: ptosis not present, extra-ocular motions intact bilaterally V,VII: smile symmetric, facial light touch sensation decreased on the left splitting the midline VIII: hearing normal bilaterally IX,X: gag reflex present XI: bilateral shoulder shrug XII: midline tongue extension Motor: Right : Upper extremity   5/5    Left:     Upper extremity   4/5 drift with no pronation When both upper extremities tested together both arms drop, equally weak  Lower extremity   5/5     Lower extremity   5-/5 Tone and bulk:normal tone throughout; no atrophy noted Sensory: Pinprick and light touch decreased on the left splitting the trunk at the midline.  Vibratory sensation splits the midline on the trunk and head Deep Tendon Reflexes: 2+ and symmetric throughout Plantars: Right: downgoing   Left: downgoing Cerebellar: Normal finger-to-nose and normal heel-to-shin testing bilaterally Gait: not tested due to safety concerns    Laboratory Studies:   Basic Metabolic Panel:  Recent Labs Lab 11/02/16 1816 11/03/16 0126  NA 138  --   K 3.7  --   CL 104  --   CO2 25  --   GLUCOSE 103*  --   BUN 9  --   CREATININE 0.80 0.75  CALCIUM 9.2  --     Liver Function Tests:  Recent Labs Lab 11/02/16 1816  AST 28  ALT 28  ALKPHOS 59  BILITOT 0.4  PROT 8.0  ALBUMIN 4.0   No results for input(s): LIPASE, AMYLASE in the last 168 hours. No results for input(s): AMMONIA in the last 168 hours.  CBC:  Recent Labs Lab 11/02/16 1816 11/03/16 0126  WBC 13.4* 9.3  NEUTROABS 7.3*  --    HGB 13.9 12.5  HCT 40.4 35.3  MCV 91.9 93.0  PLT 332 277    Cardiac Enzymes:  Recent Labs Lab 11/02/16 1816  TROPONINI <0.03    BNP: Invalid input(s): POCBNP  CBG:  Recent Labs Lab 11/02/16 1844  GLUCAP 107*    Microbiology: No results found for this or any previous visit.  Coagulation Studies:  Recent Labs  11/02/16 1816  LABPROT 12.4  INR 0.92    Urinalysis: No results for input(s): COLORURINE, LABSPEC, PHURINE, GLUCOSEU, HGBUR, BILIRUBINUR, KETONESUR, PROTEINUR, UROBILINOGEN, NITRITE, LEUKOCYTESUR in the last 168 hours.  Invalid input(s): APPERANCEUR  Lipid Panel:     Component Value Date/Time   CHOL 140 11/03/2016 0126   TRIG 36 11/03/2016 0126   HDL 35 (L) 11/03/2016 0126   CHOLHDL 4.0 11/03/2016 0126   VLDL 7 11/03/2016 0126   LDLCALC 98 11/03/2016 0126    HgbA1C: No results found for: HGBA1C  Urine Drug Screen:  No results found for: LABOPIA, COCAINSCRNUR, LABBENZ, AMPHETMU, THCU, LABBARB  Alcohol Level: No results for input(s): ETH in the last 168 hours.  Other results: EKG: sinus rhythm at 87 bpm.  Imaging: Dg Chest 2 View  Result Date: 11/03/2016 CLINICAL DATA:  36 year old female with left side weakness since yesterday. EXAM: CHEST  2 VIEW COMPARISON:  Brain MRI 11/02/2016. FINDINGS: Low normal  lung volumes. Normal cardiac size and mediastinal contours. Visualized tracheal air column is within normal limits. No pneumothorax, pulmonary edema, pleural effusion or confluent pulmonary opacity. Cholecystectomy clips in the right upper quadrant. Negative visible bowel gas pattern. No acute osseous abnormality identified. IMPRESSION: Negative.  No acute cardiopulmonary abnormality. Electronically Signed   By: Genevie Ann M.D.   On: 11/03/2016 08:02   Mr Jodene Nam Head Wo Contrast  Result Date: 11/02/2016 CLINICAL DATA:  Initial evaluation for left-sided weakness and numbness with intermittent left-sided visual loss, left-sided headache. EXAM: MRI HEAD  WITHOUT CONTRAST MRA HEAD WITHOUT CONTRAST MRA NECK WITHOUT AND WITH CONTRAST MRV HEAD WITHOUT CONTRAST TECHNIQUE: Multiplanar, multiecho pulse sequences of the brain and surrounding structures were obtained without intravenous contrast. Angiographic images of the Circle of Willis were obtained using MRA technique without intravenous contrast. Angiographic images of the neck were obtained using MRA technique without and with intravenous contrast. Carotid stenosis measurements (when applicable) are obtained utilizing NASCET criteria, using the distal internal carotid diameter as the denominator. CONTRAST:  84m MULTIHANCE GADOBENATE DIMEGLUMINE 529 MG/ML IV SOLN COMPARISON:  Prior CT from earlier the same day. FINDINGS: MRI HEAD FINDINGS Cerebral volume within normal limits for patient age. Few scattered T2/FLAIR hyperintense foci noted within the periventricular and deep white matter of the left cerebral hemisphere. Most prominent of these foci in noted within the left periatrial white matter node and measures 1 cm (series 8, image 14). These foci are nonspecific. Two adjacent foci of encephalomalacia within the inferior left cerebellar hemisphere suspected to be related to remote ischemia (series 7, image 6). No abnormal foci of restricted diffusion to suggest acute or subacute ischemia. Gray-white matter differentiation otherwise maintained. No other evidence for chronic infarction. Single tiny foci of susceptibility artifact within the mid pons noted, consistent with a small chronic microhemorrhage (series 9, image 9). No other evidence for acute or chronic intracranial hemorrhage. No mass lesion, midline shift, or mass effect. No hydrocephalus. No extra-axial fluid collection. Major dural sinuses are grossly patent. Incidental note made of a partially empty sella. Pituitary gland suprasellar region otherwise unremarkable. Midline structures intact and normal. Major intracranial vascular flow voids are  maintained. Craniocervical junction within normal limits. Visualized upper cervical spine unremarkable. Bone marrow signal intensity within normal limits. No scalp soft tissue abnormality. Globes and orbital soft tissues within normal limits. Paranasal sinuses are clear. No mastoid effusion. Inner ear structures grossly normal. MRA HEAD FINDINGS ANTERIOR CIRCULATION: Distal cervical segments of the internal carotid arteries are widely patent with antegrade flow. Petrous, cavernous, and supraclinoid segments widely patent bilaterally without stenosis. Origin of the ophthalmic arteries patent. ICA termini widely patent. A1 segments widely patent. Anterior cerebral arteries widely patent to their distal aspects. M1 segments patent without stenosis or occlusion. MCA bifurcations normal. No proximal M2 occlusion. Distal MCA branches well opacified and symmetric parent POSTERIOR CIRCULATION: Vertebral artery's patent to the vertebrobasilar junction without stenosis. Posterior inferior cerebral arteries patent proximally. Basilar artery widely patent. Superior cerebral arteries patent bilaterally. Both of the posterior cerebral arteries primarily supplied via the basilar and are widely patent to their distal aspects. Small bilateral posterior communicating arteries noted. No aneurysm or vascular malformation. MRV HEAD FINDINGS: The superior sagittal sinus widely patent. Torcula widely patent. Right transverse and sigmoid sinuses are widely patent and dominant. Proximal right internal jugular vein patent. Left transverse and sigmoid sinuses are hypoplastic but patent as well. Proximal left internal jugular vein patent. No obvious abrupt cut off to suggest dural sinus  stenosis. Straight sinus, vein of Galen, and internal cerebral veins patent bilaterally. No obvious cortical vein thrombosis. The MRA NECK FINDINGS Source images reviewed. Visualized aortic arch of normal caliber with normal branch pattern. No high-grade  flow-limiting stenosis about the origin of the great vessels. Visualized subclavian arteries widely patent. Right common and internal carotid artery's are widely patent from their origins to the skullbase without stenosis or occlusion. No significant atheromatous irregularity or narrowing about the right carotid bifurcation. Left common and internal carotid arteries are widely patent from their origins to the skullbase without stenosis or occlusion. No significant atheromatous irregularity or narrowing about the left carotid bifurcation. Both of the vertebral arteries arise from the subclavian arteries. Vertebral arteries are largely code dominant. Vertebral arteries widely patent within the neck without stenosis or occlusion. IMPRESSION: MRI HEAD IMPRESSION: 1. No acute intracranial process identified. 2. Few patchy T2/FLAIR hyperintense foci involving the periventricular and deep white matter of the left cerebral hemisphere as above, somewhat advanced for age. These foci are nonspecific. Primary differential considerations include accelerated chronic small vessel ischemic disease, sequelae of migrainous disorder, vasculitis, or other prior infectious or inflammatory process. Although not classic for demyelinating disease, this is not entirely excluded, and could be considered in the correct clinical setting. 3. 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 4. Otherwise normal brain MRI. MRA HEAD IMPRESSION: Normal intracranial MRA. MRV HEAD IMPRESSION: Normal intracranial MRV. MRA NECK IMPRESSION: Normal MRA of the neck. Electronically Signed   By: Jeannine Boga M.D.   On: 11/02/2016 22:41   Mr Angiogram Neck W Or Wo Contrast  Result Date: 11/02/2016 CLINICAL DATA:  Initial evaluation for left-sided weakness and numbness with intermittent left-sided visual loss, left-sided headache. EXAM: MRI HEAD WITHOUT CONTRAST MRA  HEAD WITHOUT CONTRAST MRA NECK WITHOUT AND WITH CONTRAST MRV HEAD WITHOUT CONTRAST TECHNIQUE: Multiplanar, multiecho pulse sequences of the brain and surrounding structures were obtained without intravenous contrast. Angiographic images of the Circle of Willis were obtained using MRA technique without intravenous contrast. Angiographic images of the neck were obtained using MRA technique without and with intravenous contrast. Carotid stenosis measurements (when applicable) are obtained utilizing NASCET criteria, using the distal internal carotid diameter as the denominator. CONTRAST:  40m MULTIHANCE GADOBENATE DIMEGLUMINE 529 MG/ML IV SOLN COMPARISON:  Prior CT from earlier the same day. FINDINGS: MRI HEAD FINDINGS Cerebral volume within normal limits for patient age. Few scattered T2/FLAIR hyperintense foci noted within the periventricular and deep white matter of the left cerebral hemisphere. Most prominent of these foci in noted within the left periatrial white matter node and measures 1 cm (series 8, image 14). These foci are nonspecific. Two adjacent foci of encephalomalacia within the inferior left cerebellar hemisphere suspected to be related to remote ischemia (series 7, image 6). No abnormal foci of restricted diffusion to suggest acute or subacute ischemia. Gray-white matter differentiation otherwise maintained. No other evidence for chronic infarction. Single tiny foci of susceptibility artifact within the mid pons noted, consistent with a small chronic microhemorrhage (series 9, image 9). No other evidence for acute or chronic intracranial hemorrhage. No mass lesion, midline shift, or mass effect. No hydrocephalus. No extra-axial fluid collection. Major dural sinuses are grossly patent. Incidental note made of a partially empty sella. Pituitary gland suprasellar region otherwise unremarkable. Midline structures intact and normal. Major intracranial vascular flow voids are maintained. Craniocervical  junction within normal limits. Visualized upper cervical spine unremarkable. Bone marrow  signal intensity within normal limits. No scalp soft tissue abnormality. Globes and orbital soft tissues within normal limits. Paranasal sinuses are clear. No mastoid effusion. Inner ear structures grossly normal. MRA HEAD FINDINGS ANTERIOR CIRCULATION: Distal cervical segments of the internal carotid arteries are widely patent with antegrade flow. Petrous, cavernous, and supraclinoid segments widely patent bilaterally without stenosis. Origin of the ophthalmic arteries patent. ICA termini widely patent. A1 segments widely patent. Anterior cerebral arteries widely patent to their distal aspects. M1 segments patent without stenosis or occlusion. MCA bifurcations normal. No proximal M2 occlusion. Distal MCA branches well opacified and symmetric parent POSTERIOR CIRCULATION: Vertebral artery's patent to the vertebrobasilar junction without stenosis. Posterior inferior cerebral arteries patent proximally. Basilar artery widely patent. Superior cerebral arteries patent bilaterally. Both of the posterior cerebral arteries primarily supplied via the basilar and are widely patent to their distal aspects. Small bilateral posterior communicating arteries noted. No aneurysm or vascular malformation. MRV HEAD FINDINGS: The superior sagittal sinus widely patent. Torcula widely patent. Right transverse and sigmoid sinuses are widely patent and dominant. Proximal right internal jugular vein patent. Left transverse and sigmoid sinuses are hypoplastic but patent as well. Proximal left internal jugular vein patent. No obvious abrupt cut off to suggest dural sinus stenosis. Straight sinus, vein of Galen, and internal cerebral veins patent bilaterally. No obvious cortical vein thrombosis. The MRA NECK FINDINGS Source images reviewed. Visualized aortic arch of normal caliber with normal branch pattern. No high-grade flow-limiting stenosis about the  origin of the great vessels. Visualized subclavian arteries widely patent. Right common and internal carotid artery's are widely patent from their origins to the skullbase without stenosis or occlusion. No significant atheromatous irregularity or narrowing about the right carotid bifurcation. Left common and internal carotid arteries are widely patent from their origins to the skullbase without stenosis or occlusion. No significant atheromatous irregularity or narrowing about the left carotid bifurcation. Both of the vertebral arteries arise from the subclavian arteries. Vertebral arteries are largely code dominant. Vertebral arteries widely patent within the neck without stenosis or occlusion. IMPRESSION: MRI HEAD IMPRESSION: 1. No acute intracranial process identified. 2. Few patchy T2/FLAIR hyperintense foci involving the periventricular and deep white matter of the left cerebral hemisphere as above, somewhat advanced for age. These foci are nonspecific. Primary differential considerations include accelerated chronic small vessel ischemic disease, sequelae of migrainous disorder, vasculitis, or other prior infectious or inflammatory process. Although not classic for demyelinating disease, this is not entirely excluded, and could be considered in the correct clinical setting. 3. 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 4. Otherwise normal brain MRI. MRA HEAD IMPRESSION: Normal intracranial MRA. MRV HEAD IMPRESSION: Normal intracranial MRV. MRA NECK IMPRESSION: Normal MRA of the neck. Electronically Signed   By: Jeannine Boga M.D.   On: 11/02/2016 22:41   Mr Brain Wo Contrast  Result Date: 11/02/2016 CLINICAL DATA:  Initial evaluation for left-sided weakness and numbness with intermittent left-sided visual loss, left-sided headache. EXAM: MRI HEAD WITHOUT CONTRAST MRA HEAD WITHOUT CONTRAST MRA NECK WITHOUT AND  WITH CONTRAST MRV HEAD WITHOUT CONTRAST TECHNIQUE: Multiplanar, multiecho pulse sequences of the brain and surrounding structures were obtained without intravenous contrast. Angiographic images of the Circle of Willis were obtained using MRA technique without intravenous contrast. Angiographic images of the neck were obtained using MRA technique without and with intravenous contrast. Carotid stenosis measurements (when applicable) are obtained utilizing NASCET criteria, using the distal internal carotid diameter as  the denominator. CONTRAST:  22m MULTIHANCE GADOBENATE DIMEGLUMINE 529 MG/ML IV SOLN COMPARISON:  Prior CT from earlier the same day. FINDINGS: MRI HEAD FINDINGS Cerebral volume within normal limits for patient age. Few scattered T2/FLAIR hyperintense foci noted within the periventricular and deep white matter of the left cerebral hemisphere. Most prominent of these foci in noted within the left periatrial white matter node and measures 1 cm (series 8, image 14). These foci are nonspecific. Two adjacent foci of encephalomalacia within the inferior left cerebellar hemisphere suspected to be related to remote ischemia (series 7, image 6). No abnormal foci of restricted diffusion to suggest acute or subacute ischemia. Gray-white matter differentiation otherwise maintained. No other evidence for chronic infarction. Single tiny foci of susceptibility artifact within the mid pons noted, consistent with a small chronic microhemorrhage (series 9, image 9). No other evidence for acute or chronic intracranial hemorrhage. No mass lesion, midline shift, or mass effect. No hydrocephalus. No extra-axial fluid collection. Major dural sinuses are grossly patent. Incidental note made of a partially empty sella. Pituitary gland suprasellar region otherwise unremarkable. Midline structures intact and normal. Major intracranial vascular flow voids are maintained. Craniocervical junction within normal limits. Visualized upper  cervical spine unremarkable. Bone marrow signal intensity within normal limits. No scalp soft tissue abnormality. Globes and orbital soft tissues within normal limits. Paranasal sinuses are clear. No mastoid effusion. Inner ear structures grossly normal. MRA HEAD FINDINGS ANTERIOR CIRCULATION: Distal cervical segments of the internal carotid arteries are widely patent with antegrade flow. Petrous, cavernous, and supraclinoid segments widely patent bilaterally without stenosis. Origin of the ophthalmic arteries patent. ICA termini widely patent. A1 segments widely patent. Anterior cerebral arteries widely patent to their distal aspects. M1 segments patent without stenosis or occlusion. MCA bifurcations normal. No proximal M2 occlusion. Distal MCA branches well opacified and symmetric parent POSTERIOR CIRCULATION: Vertebral artery's patent to the vertebrobasilar junction without stenosis. Posterior inferior cerebral arteries patent proximally. Basilar artery widely patent. Superior cerebral arteries patent bilaterally. Both of the posterior cerebral arteries primarily supplied via the basilar and are widely patent to their distal aspects. Small bilateral posterior communicating arteries noted. No aneurysm or vascular malformation. MRV HEAD FINDINGS: The superior sagittal sinus widely patent. Torcula widely patent. Right transverse and sigmoid sinuses are widely patent and dominant. Proximal right internal jugular vein patent. Left transverse and sigmoid sinuses are hypoplastic but patent as well. Proximal left internal jugular vein patent. No obvious abrupt cut off to suggest dural sinus stenosis. Straight sinus, vein of Galen, and internal cerebral veins patent bilaterally. No obvious cortical vein thrombosis. The MRA NECK FINDINGS Source images reviewed. Visualized aortic arch of normal caliber with normal branch pattern. No high-grade flow-limiting stenosis about the origin of the great vessels. Visualized  subclavian arteries widely patent. Right common and internal carotid artery's are widely patent from their origins to the skullbase without stenosis or occlusion. No significant atheromatous irregularity or narrowing about the right carotid bifurcation. Left common and internal carotid arteries are widely patent from their origins to the skullbase without stenosis or occlusion. No significant atheromatous irregularity or narrowing about the left carotid bifurcation. Both of the vertebral arteries arise from the subclavian arteries. Vertebral arteries are largely code dominant. Vertebral arteries widely patent within the neck without stenosis or occlusion. IMPRESSION: MRI HEAD IMPRESSION: 1. No acute intracranial process identified. 2. Few patchy T2/FLAIR hyperintense foci involving the periventricular and deep white matter of the left cerebral hemisphere as above, somewhat advanced for age. These foci are  nonspecific. Primary differential considerations include accelerated chronic small vessel ischemic disease, sequelae of migrainous disorder, vasculitis, or other prior infectious or inflammatory process. Although not classic for demyelinating disease, this is not entirely excluded, and could be considered in the correct clinical setting. 3. 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 4. Otherwise normal brain MRI. MRA HEAD IMPRESSION: Normal intracranial MRA. MRV HEAD IMPRESSION: Normal intracranial MRV. MRA NECK IMPRESSION: Normal MRA of the neck. Electronically Signed   By: Jeannine Boga M.D.   On: 11/02/2016 22:41   Mr Mrv Head Wo Cm  Result Date: 11/02/2016 CLINICAL DATA:  Initial evaluation for left-sided weakness and numbness with intermittent left-sided visual loss, left-sided headache. EXAM: MRI HEAD WITHOUT CONTRAST MRA HEAD WITHOUT CONTRAST MRA NECK WITHOUT AND WITH CONTRAST MRV HEAD WITHOUT CONTRAST  TECHNIQUE: Multiplanar, multiecho pulse sequences of the brain and surrounding structures were obtained without intravenous contrast. Angiographic images of the Circle of Willis were obtained using MRA technique without intravenous contrast. Angiographic images of the neck were obtained using MRA technique without and with intravenous contrast. Carotid stenosis measurements (when applicable) are obtained utilizing NASCET criteria, using the distal internal carotid diameter as the denominator. CONTRAST:  57m MULTIHANCE GADOBENATE DIMEGLUMINE 529 MG/ML IV SOLN COMPARISON:  Prior CT from earlier the same day. FINDINGS: MRI HEAD FINDINGS Cerebral volume within normal limits for patient age. Few scattered T2/FLAIR hyperintense foci noted within the periventricular and deep white matter of the left cerebral hemisphere. Most prominent of these foci in noted within the left periatrial white matter node and measures 1 cm (series 8, image 14). These foci are nonspecific. Two adjacent foci of encephalomalacia within the inferior left cerebellar hemisphere suspected to be related to remote ischemia (series 7, image 6). No abnormal foci of restricted diffusion to suggest acute or subacute ischemia. Gray-white matter differentiation otherwise maintained. No other evidence for chronic infarction. Single tiny foci of susceptibility artifact within the mid pons noted, consistent with a small chronic microhemorrhage (series 9, image 9). No other evidence for acute or chronic intracranial hemorrhage. No mass lesion, midline shift, or mass effect. No hydrocephalus. No extra-axial fluid collection. Major dural sinuses are grossly patent. Incidental note made of a partially empty sella. Pituitary gland suprasellar region otherwise unremarkable. Midline structures intact and normal. Major intracranial vascular flow voids are maintained. Craniocervical junction within normal limits. Visualized upper cervical spine unremarkable. Bone  marrow signal intensity within normal limits. No scalp soft tissue abnormality. Globes and orbital soft tissues within normal limits. Paranasal sinuses are clear. No mastoid effusion. Inner ear structures grossly normal. MRA HEAD FINDINGS ANTERIOR CIRCULATION: Distal cervical segments of the internal carotid arteries are widely patent with antegrade flow. Petrous, cavernous, and supraclinoid segments widely patent bilaterally without stenosis. Origin of the ophthalmic arteries patent. ICA termini widely patent. A1 segments widely patent. Anterior cerebral arteries widely patent to their distal aspects. M1 segments patent without stenosis or occlusion. MCA bifurcations normal. No proximal M2 occlusion. Distal MCA branches well opacified and symmetric parent POSTERIOR CIRCULATION: Vertebral artery's patent to the vertebrobasilar junction without stenosis. Posterior inferior cerebral arteries patent proximally. Basilar artery widely patent. Superior cerebral arteries patent bilaterally. Both of the posterior cerebral arteries primarily supplied via the basilar and are widely patent to their distal aspects. Small bilateral posterior communicating arteries noted. No aneurysm or vascular malformation. MRV HEAD FINDINGS: The superior sagittal sinus widely patent. Torcula widely patent. Right transverse and sigmoid sinuses  are widely patent and dominant. Proximal right internal jugular vein patent. Left transverse and sigmoid sinuses are hypoplastic but patent as well. Proximal left internal jugular vein patent. No obvious abrupt cut off to suggest dural sinus stenosis. Straight sinus, vein of Galen, and internal cerebral veins patent bilaterally. No obvious cortical vein thrombosis. The MRA NECK FINDINGS Source images reviewed. Visualized aortic arch of normal caliber with normal branch pattern. No high-grade flow-limiting stenosis about the origin of the great vessels. Visualized subclavian arteries widely patent. Right  common and internal carotid artery's are widely patent from their origins to the skullbase without stenosis or occlusion. No significant atheromatous irregularity or narrowing about the right carotid bifurcation. Left common and internal carotid arteries are widely patent from their origins to the skullbase without stenosis or occlusion. No significant atheromatous irregularity or narrowing about the left carotid bifurcation. Both of the vertebral arteries arise from the subclavian arteries. Vertebral arteries are largely code dominant. Vertebral arteries widely patent within the neck without stenosis or occlusion. IMPRESSION: MRI HEAD IMPRESSION: 1. No acute intracranial process identified. 2. Few patchy T2/FLAIR hyperintense foci involving the periventricular and deep white matter of the left cerebral hemisphere as above, somewhat advanced for age. These foci are nonspecific. Primary differential considerations include accelerated chronic small vessel ischemic disease, sequelae of migrainous disorder, vasculitis, or other prior infectious or inflammatory process. Although not classic for demyelinating disease, this is not entirely excluded, and could be considered in the correct clinical setting. 3. 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 4. Otherwise normal brain MRI. MRA HEAD IMPRESSION: Normal intracranial MRA. MRV HEAD IMPRESSION: Normal intracranial MRV. MRA NECK IMPRESSION: Normal MRA of the neck. Electronically Signed   By: Jeannine Boga M.D.   On: 11/02/2016 22:41   Ct Head Code Stroke W/o Cm  Result Date: 11/02/2016 CLINICAL DATA:  Code stroke. Left-sided numbness. Onset 45 minutes prior to admission EXAM: CT HEAD WITHOUT CONTRAST TECHNIQUE: Contiguous axial images were obtained from the base of the skull through the vertex without intravenous contrast. COMPARISON:  None. FINDINGS: Brain: No evidence of  acute infarction, hemorrhage, hydrocephalus, extra-axial collection or mass lesion/mass effect. Vascular: No hyperdense vessel or unexpected calcification. Skull: Negative Sinuses/Orbits: Negative Other: None ASPECTS ([place] Stroke Program Early CT Score) - Ganglionic level infarction (caudate, lentiform nuclei, internal capsule, insula, M1-M3 cortex): 7 - Supraganglionic infarction (M4-M6 cortex): 3 Total score (0-10 with 10 being normal): 10 IMPRESSION: 1. Negative CT Head 2. ASPECTS is 10 These results were called by telephone at the time of interpretation on 11/02/2016 at 6:20 pm to Dr. Quentin Cornwall, who verbally acknowledged these results. Electronically Signed   By: Franchot Gallo M.D.   On: 11/02/2016 18:21     Assessment/Plan: 36 year old female with headache and left numbness and weakness.  Neurological examination with multiple functional features.  MRI/MRA and MRV reviewed and show no acute abnormalities.  Concern is for a complicated migraine.  Patient continues to have a headache that she rates a 6/10, although pressure-like today instead of sharp.    Recommendations: 1.  Would abort headache with Dilaudid 21m IV and use 416mIV of Decadron to attempt to prevent rebound.   2.  ESR, CRP 3.  EEG 4.  ASA 8125maily 5. Continued therapy  LesAlexis GoodellD Neurology [phone]/2018, 5:34 PM

## 2016-11-03 NOTE — Evaluation (Signed)
Occupational Therapy Evaluation Patient Details Name: Denise BostonCourtney N Lumm MRN: [id number] DOB: [birth year] Today's Date: 11/03/2016    History of Present Illness 36 y/o female here with migrains and L sided (total from mid line) weakness, coordination and sensory issues.  She reports that symptoms seemed to call down some initially, but during eval she again had headaches increased "heaviness" in L limbs.  Pt is currently has increased stress with moving and working through a divorce.   Clinical Impression   Pt seen for OT evaluation this date. Pt was independent with all aspects of ADL/IADL prior to admission, working full time and caring for 8yo daughter in a home she currently shares with her spouse as they are working through a divorce. Pt reports she had planned to move out this coming weekend. Pt presents with significant sensory deficits as well as strength deficits in L side of vision and L arm/leg. Dull/sharp sensory testing impaired, hot/cold testing intact. Increased effort/time required to perform rapid alternating movement, finger to nose, and thumb opposition testing as well as gross and fine motor tasks with LUE, with pt reporting significant effort and concentration to do simple tasks. Pt reports headache during session as well as tingling in LUE and heaviness with LUE/LLE throughout session, which started in hand and increased up her forearm to elbow. Pt with decreased confidence in sitting and standing balance, due to the additional effort needed to maintain upright position. No LOB noted during session. Pt will benefit from skilled OT services to address noted impairments and functional deficits in order to maximize return to PLOF and minimize risk of falls. Recommend OP OT following hospitalization in order to continue working towards goals in order for pt to return to PLOF which includes working full time and caring for her 8yo daughter.     Follow Up Recommendations  Outpatient OT    Equipment Recommendations  None recommended by OT    Recommendations for Other Services       Precautions / Restrictions Precautions Precautions: Fall Restrictions Weight Bearing Restrictions: No      Mobility Bed Mobility Overal bed mobility: Independent             General bed mobility comments: Pt able to get to sitting EOB w/o assist, is more deliberate (and slower) than normal  Transfers Overall transfer level: Independent Equipment used: None             General transfer comment: Safe and independent with transition to standing, but not at all near her baseline.    Balance Overall balance assessment: Needs assistance Sitting-balance support: Feet supported;No upper extremity supported Sitting balance-Leahy Scale: Normal     Standing balance support: Single extremity supported Standing balance-Leahy Scale: Good Standing balance comment: pt reports decreased confidence with balance both in sitting and standing, however no LOB noted                           ADL either performed or assessed with clinical judgement   ADL Overall ADL's : Needs assistance/impaired Eating/Feeding: Bed level;Set up   Grooming: Oral care;Min guard;Standing Grooming Details (indicate cue type and reason): stood at sink with min guard to brush teeth, LUE supported on counter, increased time/effort to open toothpaste/toothbrush but able to perform with both hands Upper Body Bathing: Bed level;Set up;Minimal assistance   Lower Body Bathing: Bed level;Set up;Minimal assistance   Upper Body Dressing : Set up;Min guard;Sitting   Lower Body Dressing:  Min guard;Sitting/lateral leans Lower Body Dressing Details (indicate cue type and reason): pt able to doff/don socks seated EOB with min guard for balance and additional effort/time when using L hand               General ADL Comments: pt generally requires min guard and may need occasional min assist for ADL tasks  due to fatigue      Vision Baseline Vision/History: Wears glasses (pt notes L eye vision is significant worse than R at baseline) Wears Glasses: At all times Patient Visual Report: Eye fatigue/eye pain/headache (pt reports increased effort/heaviness of eye lid, fatigues easily with visual input) Vision Assessment?: Yes Eye Alignment: Within Functional Limits Ocular Range of Motion: Within Functional Limits Alignment/Gaze Preference: Within Defined Limits Tracking/Visual Pursuits: Able to track stimulus in all quads without difficulty Visual Fields: No apparent deficits Additional Comments: pt reports increased fatigue and increased effort with vision, L eye/eye lid feels heavy, easily overstimulated by visual input, especially sensitive to touch and to light     Perception     Praxis      Pertinent Vitals/Pain Pain Assessment: 0-10 Pain Score: 5  Pain Location: headache (L occipital region of head) Pain Descriptors / Indicators: Aching;Constant Pain Intervention(s): Limited activity within patient's tolerance;Monitored during session;Patient requesting pain meds-RN notified     Hand Dominance Right   Extremity/Trunk Assessment Upper Extremity Assessment Upper Extremity Assessment: LUE deficits/detail (RUE WNL) LUE Deficits / Details: grossly 4-/5, impaired speed/quality of movement; impaired sensation (normal hot/cold testing, impaired sharp/dull testing, pt reports feeling "pressure" but not refined light touch, LUE tingling throughout session, increasingly up her arm), requires significant focus/attention to perform simple fine motor tasks with L hand LUE Sensation: decreased light touch LUE Coordination: decreased fine motor;decreased gross motor   Lower Extremity Assessment Lower Extremity Assessment: Defer to PT evaluation;LLE deficits/detail (RLE WNL) LLE Deficits / Details: grossly 3+/5, poor speed and quality of movement LLE Sensation: decreased light touch   Cervical  / Trunk Assessment Cervical / Trunk Assessment: Normal   Communication Communication Communication: No difficulties   Cognition Arousal/Alertness: Awake/alert Behavior During Therapy: WFL for tasks assessed/performed Overall Cognitive Status: Within Functional Limits for tasks assessed                                     General Comments       Exercises Other Exercises Other Exercises: pt/family encouraged to minimize visual/sensory input (keep lights dimmer, limit noise/activity in the room) as pt reports increased fatigue and increased headache with more sensory input   Shoulder Instructions      Home Living Family/patient expects to be discharged to:: Private residence Living Arrangements: Children;Spouse/significant other (separated from spouse, was planning to move this coming weekend) Available Help at Discharge: Family;Friend(s);Available 24 hours/day;Available PRN/intermittently Type of Home: House (in house currently, moving to a new home soon) Home Access: Level entry     Home Layout: Bed/bath upstairs (in home she is moving to)     Foot LockerBathroom Shower/Tub: Walk-in shower;Tub/shower unit (tub/shower in new home)   Bathroom Toilet: Standard     Home Equipment: None   Additional Comments: Pt currently moving out of home she shares with soon-to-be ex-husband (currently going through divorce).       Prior Functioning/Environment Level of Independence: Independent        Comments: Pt works full time, driving, regularly exercises, etc.  OT Problem List: Decreased strength;Pain;Impaired sensation;Decreased activity tolerance;Decreased knowledge of use of DME or AE;Impaired balance (sitting and/or standing);Impaired UE functional use      OT Treatment/Interventions: Self-care/ADL training;Therapeutic exercise;Therapeutic activities;Energy conservation;Neuromuscular education;DME and/or AE instruction;Patient/family education    OT  Goals(Current goals can be found in the care plan section) Acute Rehab OT Goals Patient Stated Goal: get strength/feeling back so she can move and return to work OT Goal Formulation: With patient/family Time For Goal Achievement: 11/10/16 Potential to Achieve Goals: Good  OT Frequency: Min 2X/week   Barriers to D/C:            Co-evaluation              AM-PAC PT "6 Clicks" Daily Activity     Outcome Measure Help from another person eating meals?: None Help from another person taking care of personal grooming?: None Help from another person toileting, which includes using toliet, bedpan, or urinal?: A Little Help from another person bathing (including washing, rinsing, drying)?: A Little Help from another person to put on and taking off regular upper body clothing?: None Help from another person to put on and taking off regular lower body clothing?: A Little 6 Click Score: 21   End of Session Equipment Utilized During Treatment: Gait belt Nurse Communication: Patient requests pain meds  Activity Tolerance: Patient tolerated treatment well Patient left: in bed;with call bell/phone within reach;with bed alarm set;with family/visitor present  OT Visit Diagnosis: Other abnormalities of gait and mobility (R26.89);Hemiplegia and hemiparesis Hemiplegia - Right/Left: Left Hemiplegia - dominant/non-dominant: Non-Dominant Hemiplegia - caused by: Unspecified                Time: 1610-9604 OT Time Calculation (min): 37 min Charges:  OT General Charges $OT Visit: 1 Procedure OT Evaluation $OT Eval Low Complexity: 1 Procedure OT Treatments $Self Care/Home Management : 8-22 mins G-Codes: OT G-codes **NOT FOR INPATIENT CLASS** Functional Assessment Tool Used: AM-PAC 6 Clicks Daily Activity;Clinical judgement Functional Limitation: Self care Self Care Current Status (V4098): At least 20 percent but less than 40 percent impaired, limited or restricted Self Care Goal Status (J1914):  At least 1 percent but less than 20 percent impaired, limited or restricted   Richrd Prime, MPH, MS, OTR/L ascom [phone] 11/03/16, 1:34 PM

## 2016-11-03 NOTE — Progress Notes (Signed)
Pnt admission complete and pnt oriented to unit and room. Pnt reports her head pain is no longer 4/10 but increasing to 5/10. Per pnt request medicated w/PRN pain med. Pnt is significantly weaker on left side than right. NIH stroke scale score 4. Pnt resting in bed currently and appears comfortable. No other concerns at this time. Bed low and locked, call bell in reach. Will continue to monitor and assess.

## 2016-11-03 NOTE — Progress Notes (Signed)
SLP Cancellation Note  Patient Details Name: Denise BostonCourtney N Gravelle MRN: [id number] DOB: Mar 18, 1981   Cancelled treatment:       Reason Eval/Treat Not Completed: SLP screened, no needs identified, will sign off (chart reviewed; consulted NSG then pt/family) Pt denied any difficulty swallowing and is currently on a regular diet; tolerates swallowing pills w/ water per NSG. Pt conversed at conversational level w/out deficits noted; pt was A/O using cell phone independently. Pt and family denied any speech-language deficits.  No further skilled ST services indicated as pt appears at her baseline. Pt agreed. NSG to reconsult if any change in status.    Jerilynn SomKatherine Watson, MS, CCC-SLP Watson,Katherine 11/03/2016, 5:45 PM

## 2016-11-03 NOTE — Progress Notes (Signed)
Falls arm band placed, pnt made aware of why and verbalized understanding.

## 2016-11-03 NOTE — ED Notes (Addendum)
Report called to jenn rn floor nurse   Pt ate dinner tray.

## 2016-11-03 NOTE — Progress Notes (Signed)
*  PRELIMINARY RESULTS* Echocardiogram 2D Echocardiogram has been performed.  Denise BlueHege, Denise Peterson 11/03/2016, 4:04 PM

## 2016-11-04 LAB — HEMOGLOBIN A1C
Hgb A1c MFr Bld: 5.4 % (ref 4.8–5.6)
MEAN PLASMA GLUCOSE: 108 mg/dL

## 2016-11-04 LAB — HIV ANTIBODY (ROUTINE TESTING W REFLEX): HIV Screen 4th Generation wRfx: NONREACTIVE

## 2016-11-04 LAB — ECHOCARDIOGRAM COMPLETE
Height: 67 in
Weight: 4448 oz

## 2016-11-04 MED ORDER — PREDNISONE 50 MG PO TABS: 60.0000 mg | ORAL_TABLET | Freq: Every day | ORAL | Status: DC

## 2016-11-04 MED ORDER — MAGNESIUM SULFATE 2 GM/50ML IV SOLN
2.0000 g | Freq: Once | INTRAVENOUS | Status: AC
  Administered 2016-11-04: 15:00:00 2 g via INTRAVENOUS
  Filled 2016-11-04: qty 50

## 2016-11-04 MED ORDER — ZOLPIDEM TARTRATE 5 MG PO TABS: 5.0000 mg | ORAL_TABLET | Freq: Every evening | ORAL | 0 refills | Status: DC | PRN

## 2016-11-04 MED ORDER — ONDANSETRON HCL 4 MG PO TABS: 4.0000 mg | ORAL_TABLET | Freq: Three times a day (TID) | ORAL | 0 refills | Status: AC | PRN

## 2016-11-04 MED ORDER — IBUPROFEN 200 MG PO TABS: 600.0000 mg | ORAL_TABLET | Freq: Four times a day (QID) | ORAL | 0 refills | Status: AC | PRN

## 2016-11-04 MED ORDER — PREDNISONE 10 MG (21) PO TBPK
ORAL_TABLET | ORAL | 0 refills | Status: DC
Start: 2016-11-13 — End: 2017-04-10

## 2016-11-04 MED ORDER — ONDANSETRON HCL 4 MG/2ML IJ SOLN
4.0000 mg | Freq: Four times a day (QID) | INTRAMUSCULAR | Status: DC | PRN
  Administered 2016-11-04: 4 mg via INTRAVENOUS
  Filled 2016-11-04: qty 2

## 2016-11-04 MED ORDER — PREDNISONE 50 MG PO TABS
60.0000 mg | ORAL_TABLET | Freq: Every day | ORAL | Status: DC
  Administered 2016-11-04: 60 mg via ORAL
  Filled 2016-11-04: qty 1

## 2016-11-04 MED ORDER — PREDNISONE 20 MG PO TABS: 60.0000 mg | ORAL_TABLET | Freq: Every day | ORAL | 0 refills | Status: AC

## 2016-11-04 NOTE — Care Management (Signed)
Patient works full time. She has no insurance for PT or OT. Has been given open door and medication management clinic application.

## 2016-11-04 NOTE — Discharge Instructions (Signed)
Neurology and PT clinic follow ups.

## 2016-11-04 NOTE — Progress Notes (Signed)
[facility]         [place]   11/04/2016  Patient: Denise Peterson   Date of Birth:  Jul 04, 1980  Date of admission:  11/02/2016  Date of Discharge  11/04/2016    To Whom it May Concern:   Denise Peterson  may return to work on 11/07/16.  PHYSICAL ACTIVITY:  Full  If you have any questions or concerns, please don't hesitate to call.  Sincerely,   Altamese DillingVACHHANI, Lovette Merta M.D Pager [phone]- [phone] Office : [phone]   .

## 2016-11-04 NOTE — Progress Notes (Signed)
Physical Therapy Treatment Patient Details Name: Denise Peterson MRN: [id number] DOB: May 19, 1980 Today's Date: 11/04/2016    History of Present Illness 36 y/o female here with migrains and L sided (total from mid line) weakness, coordination and sensory issues.  She reports that symptoms seemed to call down some initially, but during eval she again had headaches increased "heaviness" in L limbs.  Pt is currently has increased stress with moving and working through a divorce.    PT Comments    Pt reports less numbness and heaviness today in L side.  Out of bed and to bathroom with supervision.  She did report some dizziness but stated it was with her eyes closed.  Reported it was worse in hallways with bright lights.  She was able to ambulate around unit x 1 with supervision/min guard x 1.  Pt's mom in for session.  Observed.  Stated she has been helping pt to/from bathroom without incident.  Stated she is a Designer, jewellerypracticing RN.  Stated she is comfortable and asked if it was ok to ambulate with pt in hallways.  OK given.  Gait belt in room and encouraged her to use it with mobility.  Pt aware of her limitations and has good safety awareness.    Follow Up Recommendations  Outpatient PT     Equipment Recommendations  None recommended by PT    Recommendations for Other Services       Precautions / Restrictions Precautions Precautions: Fall Restrictions Weight Bearing Restrictions: No    Mobility  Bed Mobility Overal bed mobility: Independent             General bed mobility comments: Pt able to get to sitting EOB w/o assist, is more deliberate (and slower) than normal  Transfers Overall transfer level: Independent Equipment used: None             General transfer comment: Safe and independent with transition to standing, but not at all near her baseline.  Ambulation/Gait   Ambulation Distance (Feet): 300 Feet Assistive device: None     Gait velocity interpretation:  Below normal speed for age/gender General Gait Details: Slow gait but pt reports less heaviness and numbness today.  Some dizziness with eyes closed.   Stairs            Wheelchair Mobility    Modified Rankin (Stroke Patients Only)       Balance Overall balance assessment: Needs assistance Sitting-balance support: Feet supported;No upper extremity supported Sitting balance-Leahy Scale: Normal     Standing balance support: No upper extremity supported;Single extremity supported Standing balance-Leahy Scale: Good Standing balance comment: pt reports decreased confidence with balance both in sitting and standing, however no LOB noted                            Cognition Arousal/Alertness: Awake/alert Behavior During Therapy: WFL for tasks assessed/performed Overall Cognitive Status: Within Functional Limits for tasks assessed                                        Exercises Other Exercises Other Exercises: to commode with supervision    General Comments        Pertinent Vitals/Pain Pain Assessment: 0-10 Pain Score: 5  Pain Location: headache (L occipital region of head) Pain Descriptors / Indicators: Aching;Constant Pain Intervention(s): Limited activity within patient's tolerance  Home Living                      Prior Function            PT Goals (current goals can now be found in the care plan section) Progress towards PT goals: Progressing toward goals    Frequency    7X/week      PT Plan Current plan remains appropriate    Co-evaluation              AM-PAC PT "6 Clicks" Daily Activity  Outcome Measure  Difficulty turning over in bed (including adjusting bedclothes, sheets and blankets)?: None Difficulty moving from lying on back to sitting on the side of the bed? : None Difficulty sitting down on and standing up from a chair with arms (e.g., wheelchair, bedside commode, etc,.)?: None Help  needed moving to and from a bed to chair (including a wheelchair)?: None Help needed walking in hospital room?: None Help needed climbing 3-5 steps with a railing? : None 6 Click Score: 24    End of Session Equipment Utilized During Treatment: Gait belt Activity Tolerance: Patient tolerated treatment well Patient left: in chair;with call bell/phone within reach;with family/visitor present         Time: 5784-69620855-0903 PT Time Calculation (min) (ACUTE ONLY): 8 min  Charges:  $Gait Training: 8-22 mins                    G Codes:       Denise Peterson, PTA 11/04/16, 9:20 AM

## 2016-11-04 NOTE — Progress Notes (Signed)
OT Cancellation Note  Patient Details Name: Denise BostonCourtney N Frazee MRN: [id number] DOB: [birth year]   Cancelled Treatment:    Reason Eval/Treat Not Completed: Patient declined, no reason specified. Pt preparing for discharge upon attempt. Politely declining OT treatment at this time. Pt reports plan to follow up with outpatient OT/PT after discharge. Will hold OT at this time.   Richrd PrimeJamie Stiller, MPH, MS, OTR/L ascom [phone] 11/04/16, 4:18 PM

## 2016-11-04 NOTE — Plan of Care (Signed)
Pt d/ced home.  She was admitted with severe headache and heavy feeling on L side.  Tests were negative for stroke.  Still having headache - administered Mg and prednisone.  Pt wked with PT and her mother who is a nurse stated that her mobility had improved.  She is A&O w/no visual/speech deficits. She is going through a stressful time - but she has good family support.  Will review d/c instructions and f/u appts with pt and family.  IV removed.  Pt will go home with family.

## 2016-11-04 NOTE — Progress Notes (Signed)
Subjective: Headache severity has fluctuated but not completely resolved despite Dilaudid.  Weakness improved but not completely resolved as well.    Objective: Current vital signs: BP (!) 106/49 (BP Location: Right Arm)   Pulse 66   Temp 98 F (36.7 C) (Oral)   Resp 14   Ht '5\' 7"'  (1.702 m)   Wt 126.1 kg (278 lb)   LMP 10/21/2016 (Approximate)   SpO2 98%   BMI 43.54 kg/m  Vital signs in last 24 hours: Temp:  [98 F (36.7 C)-98.1 F (36.7 C)] 98 F (36.7 C) (08/02 0405) Pulse Rate:  [58-66] 66 (08/02 0405) Resp:  [14] 14 (08/02 0405) BP: (100-108)/(49-68) 106/49 (08/02 0405) SpO2:  [96 %-98 %] 98 % (08/02 0405)  Intake/Output from previous day: No intake/output data recorded. Intake/Output this shift: Total I/O In: 410 [P.O.:360; IV Piggyback:50] Out: -  Nutritional status: Diet regular Room service appropriate? Yes; Fluid consistency: Thin Diet - low sodium heart healthy  Neurologic Exam: Mental Status: Alert, oriented, thought content appropriate.  Speech fluent without evidence of aphasia.  Able to follow 3 step commands without difficulty. Cranial Nerves: II: Discs flat bilaterally; Visual fields grossly normal, pupils equal, round, reactive to light and accommodation III,IV, VI: ptosis not present, extra-ocular motions intact bilaterally V,VII: smile symmetric, facial light touch sensation decreased on the left splitting the midline VIII: hearing normal bilaterally IX,X: gag reflex present XI: bilateral shoulder shrug XII: midline tongue extension Motor: Right :  Upper extremity   5/5                                      Left:     Upper extremity   5-/5 drift with no pronation When both upper extremities tested together both arms drop, equally weak             Lower extremity   5/5                                                  Lower extremity   5-/5 Tone and bulk:normal tone throughout; no atrophy noted   Lab Results: Basic Metabolic Panel:  Recent  Labs Lab 11/02/16 1816 11/03/16 0126  NA 138  --   K 3.7  --   CL 104  --   CO2 25  --   GLUCOSE 103*  --   BUN 9  --   CREATININE 0.80 0.75  CALCIUM 9.2  --     Liver Function Tests:  Recent Labs Lab 11/02/16 1816  AST 28  ALT 28  ALKPHOS 59  BILITOT 0.4  PROT 8.0  ALBUMIN 4.0   No results for input(s): LIPASE, AMYLASE in the last 168 hours. No results for input(s): AMMONIA in the last 168 hours.  CBC:  Recent Labs Lab 11/02/16 1816 11/03/16 0126  WBC 13.4* 9.3  NEUTROABS 7.3*  --   HGB 13.9 12.5  HCT 40.4 35.3  MCV 91.9 93.0  PLT 332 277    Cardiac Enzymes:  Recent Labs Lab 11/02/16 1816  TROPONINI <0.03    Lipid Panel:  Recent Labs Lab 11/03/16 0126  CHOL 140  TRIG 36  HDL 35*  CHOLHDL 4.0  VLDL 7  LDLCALC 98    CBG:  Recent Labs  Lab 11/02/16 1844  GLUCAP 107*    Microbiology: No results found for this or any previous visit.  Coagulation Studies:  Recent Labs  11/02/16 1816  LABPROT 12.4  INR 0.92    Imaging: Dg Chest 2 View  Result Date: 11/03/2016 CLINICAL DATA:  36 year old female with left side weakness since yesterday. EXAM: CHEST  2 VIEW COMPARISON:  Brain MRI 11/02/2016. FINDINGS: Low normal lung volumes. Normal cardiac size and mediastinal contours. Visualized tracheal air column is within normal limits. No pneumothorax, pulmonary edema, pleural effusion or confluent pulmonary opacity. Cholecystectomy clips in the right upper quadrant. Negative visible bowel gas pattern. No acute osseous abnormality identified. IMPRESSION: Negative.  No acute cardiopulmonary abnormality. Electronically Signed   By: Genevie Ann M.D.   On: 11/03/2016 08:02   Mr Jodene Nam Head Wo Contrast  Result Date: 11/02/2016 CLINICAL DATA:  Initial evaluation for left-sided weakness and numbness with intermittent left-sided visual loss, left-sided headache. EXAM: MRI HEAD WITHOUT CONTRAST MRA HEAD WITHOUT CONTRAST MRA NECK WITHOUT AND WITH CONTRAST MRV HEAD  WITHOUT CONTRAST TECHNIQUE: Multiplanar, multiecho pulse sequences of the brain and surrounding structures were obtained without intravenous contrast. Angiographic images of the Circle of Willis were obtained using MRA technique without intravenous contrast. Angiographic images of the neck were obtained using MRA technique without and with intravenous contrast. Carotid stenosis measurements (when applicable) are obtained utilizing NASCET criteria, using the distal internal carotid diameter as the denominator. CONTRAST:  75m MULTIHANCE GADOBENATE DIMEGLUMINE 529 MG/ML IV SOLN COMPARISON:  Prior CT from earlier the same day. FINDINGS: MRI HEAD FINDINGS Cerebral volume within normal limits for patient age. Few scattered T2/FLAIR hyperintense foci noted within the periventricular and deep white matter of the left cerebral hemisphere. Most prominent of these foci in noted within the left periatrial white matter node and measures 1 cm (series 8, image 14). These foci are nonspecific. Two adjacent foci of encephalomalacia within the inferior left cerebellar hemisphere suspected to be related to remote ischemia (series 7, image 6). No abnormal foci of restricted diffusion to suggest acute or subacute ischemia. Gray-white matter differentiation otherwise maintained. No other evidence for chronic infarction. Single tiny foci of susceptibility artifact within the mid pons noted, consistent with a small chronic microhemorrhage (series 9, image 9). No other evidence for acute or chronic intracranial hemorrhage. No mass lesion, midline shift, or mass effect. No hydrocephalus. No extra-axial fluid collection. Major dural sinuses are grossly patent. Incidental note made of a partially empty sella. Pituitary gland suprasellar region otherwise unremarkable. Midline structures intact and normal. Major intracranial vascular flow voids are maintained. Craniocervical junction within normal limits. Visualized upper cervical spine  unremarkable. Bone marrow signal intensity within normal limits. No scalp soft tissue abnormality. Globes and orbital soft tissues within normal limits. Paranasal sinuses are clear. No mastoid effusion. Inner ear structures grossly normal. MRA HEAD FINDINGS ANTERIOR CIRCULATION: Distal cervical segments of the internal carotid arteries are widely patent with antegrade flow. Petrous, cavernous, and supraclinoid segments widely patent bilaterally without stenosis. Origin of the ophthalmic arteries patent. ICA termini widely patent. A1 segments widely patent. Anterior cerebral arteries widely patent to their distal aspects. M1 segments patent without stenosis or occlusion. MCA bifurcations normal. No proximal M2 occlusion. Distal MCA branches well opacified and symmetric parent POSTERIOR CIRCULATION: Vertebral artery's patent to the vertebrobasilar junction without stenosis. Posterior inferior cerebral arteries patent proximally. Basilar artery widely patent. Superior cerebral arteries patent bilaterally. Both of the posterior cerebral arteries primarily supplied via the basilar and are widely  patent to their distal aspects. Small bilateral posterior communicating arteries noted. No aneurysm or vascular malformation. MRV HEAD FINDINGS: The superior sagittal sinus widely patent. Torcula widely patent. Right transverse and sigmoid sinuses are widely patent and dominant. Proximal right internal jugular vein patent. Left transverse and sigmoid sinuses are hypoplastic but patent as well. Proximal left internal jugular vein patent. No obvious abrupt cut off to suggest dural sinus stenosis. Straight sinus, vein of Galen, and internal cerebral veins patent bilaterally. No obvious cortical vein thrombosis. The MRA NECK FINDINGS Source images reviewed. Visualized aortic arch of normal caliber with normal branch pattern. No high-grade flow-limiting stenosis about the origin of the great vessels. Visualized subclavian arteries  widely patent. Right common and internal carotid artery's are widely patent from their origins to the skullbase without stenosis or occlusion. No significant atheromatous irregularity or narrowing about the right carotid bifurcation. Left common and internal carotid arteries are widely patent from their origins to the skullbase without stenosis or occlusion. No significant atheromatous irregularity or narrowing about the left carotid bifurcation. Both of the vertebral arteries arise from the subclavian arteries. Vertebral arteries are largely code dominant. Vertebral arteries widely patent within the neck without stenosis or occlusion. IMPRESSION: MRI HEAD IMPRESSION: 1. No acute intracranial process identified. 2. Few patchy T2/FLAIR hyperintense foci involving the periventricular and deep white matter of the left cerebral hemisphere as above, somewhat advanced for age. These foci are nonspecific. Primary differential considerations include accelerated chronic small vessel ischemic disease, sequelae of migrainous disorder, vasculitis, or other prior infectious or inflammatory process. Although not classic for demyelinating disease, this is not entirely excluded, and could be considered in the correct clinical setting. 3. 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 4. Otherwise normal brain MRI. MRA HEAD IMPRESSION: Normal intracranial MRA. MRV HEAD IMPRESSION: Normal intracranial MRV. MRA NECK IMPRESSION: Normal MRA of the neck. Electronically Signed   By: Jeannine Boga M.D.   On: 11/02/2016 22:41   Mr Angiogram Neck W Or Wo Contrast  Result Date: 11/02/2016 CLINICAL DATA:  Initial evaluation for left-sided weakness and numbness with intermittent left-sided visual loss, left-sided headache. EXAM: MRI HEAD WITHOUT CONTRAST MRA HEAD WITHOUT CONTRAST MRA NECK WITHOUT AND WITH CONTRAST MRV HEAD WITHOUT CONTRAST  TECHNIQUE: Multiplanar, multiecho pulse sequences of the brain and surrounding structures were obtained without intravenous contrast. Angiographic images of the Circle of Willis were obtained using MRA technique without intravenous contrast. Angiographic images of the neck were obtained using MRA technique without and with intravenous contrast. Carotid stenosis measurements (when applicable) are obtained utilizing NASCET criteria, using the distal internal carotid diameter as the denominator. CONTRAST:  57m MULTIHANCE GADOBENATE DIMEGLUMINE 529 MG/ML IV SOLN COMPARISON:  Prior CT from earlier the same day. FINDINGS: MRI HEAD FINDINGS Cerebral volume within normal limits for patient age. Few scattered T2/FLAIR hyperintense foci noted within the periventricular and deep white matter of the left cerebral hemisphere. Most prominent of these foci in noted within the left periatrial white matter node and measures 1 cm (series 8, image 14). These foci are nonspecific. Two adjacent foci of encephalomalacia within the inferior left cerebellar hemisphere suspected to be related to remote ischemia (series 7, image 6). No abnormal foci of restricted diffusion to suggest acute or subacute ischemia. Gray-white matter differentiation otherwise maintained. No other evidence for chronic infarction. Single tiny foci of susceptibility artifact within the mid pons noted, consistent with a small chronic microhemorrhage (series 9,  image 9). No other evidence for acute or chronic intracranial hemorrhage. No mass lesion, midline shift, or mass effect. No hydrocephalus. No extra-axial fluid collection. Major dural sinuses are grossly patent. Incidental note made of a partially empty sella. Pituitary gland suprasellar region otherwise unremarkable. Midline structures intact and normal. Major intracranial vascular flow voids are maintained. Craniocervical junction within normal limits. Visualized upper cervical spine unremarkable. Bone  marrow signal intensity within normal limits. No scalp soft tissue abnormality. Globes and orbital soft tissues within normal limits. Paranasal sinuses are clear. No mastoid effusion. Inner ear structures grossly normal. MRA HEAD FINDINGS ANTERIOR CIRCULATION: Distal cervical segments of the internal carotid arteries are widely patent with antegrade flow. Petrous, cavernous, and supraclinoid segments widely patent bilaterally without stenosis. Origin of the ophthalmic arteries patent. ICA termini widely patent. A1 segments widely patent. Anterior cerebral arteries widely patent to their distal aspects. M1 segments patent without stenosis or occlusion. MCA bifurcations normal. No proximal M2 occlusion. Distal MCA branches well opacified and symmetric parent POSTERIOR CIRCULATION: Vertebral artery's patent to the vertebrobasilar junction without stenosis. Posterior inferior cerebral arteries patent proximally. Basilar artery widely patent. Superior cerebral arteries patent bilaterally. Both of the posterior cerebral arteries primarily supplied via the basilar and are widely patent to their distal aspects. Small bilateral posterior communicating arteries noted. No aneurysm or vascular malformation. MRV HEAD FINDINGS: The superior sagittal sinus widely patent. Torcula widely patent. Right transverse and sigmoid sinuses are widely patent and dominant. Proximal right internal jugular vein patent. Left transverse and sigmoid sinuses are hypoplastic but patent as well. Proximal left internal jugular vein patent. No obvious abrupt cut off to suggest dural sinus stenosis. Straight sinus, vein of Galen, and internal cerebral veins patent bilaterally. No obvious cortical vein thrombosis. The MRA NECK FINDINGS Source images reviewed. Visualized aortic arch of normal caliber with normal branch pattern. No high-grade flow-limiting stenosis about the origin of the great vessels. Visualized subclavian arteries widely patent. Right  common and internal carotid artery's are widely patent from their origins to the skullbase without stenosis or occlusion. No significant atheromatous irregularity or narrowing about the right carotid bifurcation. Left common and internal carotid arteries are widely patent from their origins to the skullbase without stenosis or occlusion. No significant atheromatous irregularity or narrowing about the left carotid bifurcation. Both of the vertebral arteries arise from the subclavian arteries. Vertebral arteries are largely code dominant. Vertebral arteries widely patent within the neck without stenosis or occlusion. IMPRESSION: MRI HEAD IMPRESSION: 1. No acute intracranial process identified. 2. Few patchy T2/FLAIR hyperintense foci involving the periventricular and deep white matter of the left cerebral hemisphere as above, somewhat advanced for age. These foci are nonspecific. Primary differential considerations include accelerated chronic small vessel ischemic disease, sequelae of migrainous disorder, vasculitis, or other prior infectious or inflammatory process. Although not classic for demyelinating disease, this is not entirely excluded, and could be considered in the correct clinical setting. 3. 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 4. Otherwise normal brain MRI. MRA HEAD IMPRESSION: Normal intracranial MRA. MRV HEAD IMPRESSION: Normal intracranial MRV. MRA NECK IMPRESSION: Normal MRA of the neck. Electronically Signed   By: Jeannine Boga M.D.   On: 11/02/2016 22:41   Mr Brain Wo Contrast  Result Date: 11/02/2016 CLINICAL DATA:  Initial evaluation for left-sided weakness and numbness with intermittent left-sided visual loss, left-sided headache. EXAM: MRI HEAD WITHOUT CONTRAST MRA HEAD WITHOUT CONTRAST MRA NECK WITHOUT AND  WITH CONTRAST MRV HEAD WITHOUT CONTRAST TECHNIQUE: Multiplanar, multiecho pulse  sequences of the brain and surrounding structures were obtained without intravenous contrast. Angiographic images of the Circle of Willis were obtained using MRA technique without intravenous contrast. Angiographic images of the neck were obtained using MRA technique without and with intravenous contrast. Carotid stenosis measurements (when applicable) are obtained utilizing NASCET criteria, using the distal internal carotid diameter as the denominator. CONTRAST:  86m MULTIHANCE GADOBENATE DIMEGLUMINE 529 MG/ML IV SOLN COMPARISON:  Prior CT from earlier the same day. FINDINGS: MRI HEAD FINDINGS Cerebral volume within normal limits for patient age. Few scattered T2/FLAIR hyperintense foci noted within the periventricular and deep white matter of the left cerebral hemisphere. Most prominent of these foci in noted within the left periatrial white matter node and measures 1 cm (series 8, image 14). These foci are nonspecific. Two adjacent foci of encephalomalacia within the inferior left cerebellar hemisphere suspected to be related to remote ischemia (series 7, image 6). No abnormal foci of restricted diffusion to suggest acute or subacute ischemia. Gray-white matter differentiation otherwise maintained. No other evidence for chronic infarction. Single tiny foci of susceptibility artifact within the mid pons noted, consistent with a small chronic microhemorrhage (series 9, image 9). No other evidence for acute or chronic intracranial hemorrhage. No mass lesion, midline shift, or mass effect. No hydrocephalus. No extra-axial fluid collection. Major dural sinuses are grossly patent. Incidental note made of a partially empty sella. Pituitary gland suprasellar region otherwise unremarkable. Midline structures intact and normal. Major intracranial vascular flow voids are maintained. Craniocervical junction within normal limits. Visualized upper cervical spine unremarkable. Bone marrow signal intensity within normal limits.  No scalp soft tissue abnormality. Globes and orbital soft tissues within normal limits. Paranasal sinuses are clear. No mastoid effusion. Inner ear structures grossly normal. MRA HEAD FINDINGS ANTERIOR CIRCULATION: Distal cervical segments of the internal carotid arteries are widely patent with antegrade flow. Petrous, cavernous, and supraclinoid segments widely patent bilaterally without stenosis. Origin of the ophthalmic arteries patent. ICA termini widely patent. A1 segments widely patent. Anterior cerebral arteries widely patent to their distal aspects. M1 segments patent without stenosis or occlusion. MCA bifurcations normal. No proximal M2 occlusion. Distal MCA branches well opacified and symmetric parent POSTERIOR CIRCULATION: Vertebral artery's patent to the vertebrobasilar junction without stenosis. Posterior inferior cerebral arteries patent proximally. Basilar artery widely patent. Superior cerebral arteries patent bilaterally. Both of the posterior cerebral arteries primarily supplied via the basilar and are widely patent to their distal aspects. Small bilateral posterior communicating arteries noted. No aneurysm or vascular malformation. MRV HEAD FINDINGS: The superior sagittal sinus widely patent. Torcula widely patent. Right transverse and sigmoid sinuses are widely patent and dominant. Proximal right internal jugular vein patent. Left transverse and sigmoid sinuses are hypoplastic but patent as well. Proximal left internal jugular vein patent. No obvious abrupt cut off to suggest dural sinus stenosis. Straight sinus, vein of Galen, and internal cerebral veins patent bilaterally. No obvious cortical vein thrombosis. The MRA NECK FINDINGS Source images reviewed. Visualized aortic arch of normal caliber with normal branch pattern. No high-grade flow-limiting stenosis about the origin of the great vessels. Visualized subclavian arteries widely patent. Right common and internal carotid artery's are widely  patent from their origins to the skullbase without stenosis or occlusion. No significant atheromatous irregularity or narrowing about the right carotid bifurcation. Left common and internal carotid arteries are widely patent from their origins to the skullbase without stenosis or occlusion. No significant atheromatous irregularity or  narrowing about the left carotid bifurcation. Both of the vertebral arteries arise from the subclavian arteries. Vertebral arteries are largely code dominant. Vertebral arteries widely patent within the neck without stenosis or occlusion. IMPRESSION: MRI HEAD IMPRESSION: 1. No acute intracranial process identified. 2. Few patchy T2/FLAIR hyperintense foci involving the periventricular and deep white matter of the left cerebral hemisphere as above, somewhat advanced for age. These foci are nonspecific. Primary differential considerations include accelerated chronic small vessel ischemic disease, sequelae of migrainous disorder, vasculitis, or other prior infectious or inflammatory process. Although not classic for demyelinating disease, this is not entirely excluded, and could be considered in the correct clinical setting. 3. 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 4. Otherwise normal brain MRI. MRA HEAD IMPRESSION: Normal intracranial MRA. MRV HEAD IMPRESSION: Normal intracranial MRV. MRA NECK IMPRESSION: Normal MRA of the neck. Electronically Signed   By: Jeannine Boga M.D.   On: 11/02/2016 22:41   Mr Mrv Head Wo Cm  Result Date: 11/02/2016 CLINICAL DATA:  Initial evaluation for left-sided weakness and numbness with intermittent left-sided visual loss, left-sided headache. EXAM: MRI HEAD WITHOUT CONTRAST MRA HEAD WITHOUT CONTRAST MRA NECK WITHOUT AND WITH CONTRAST MRV HEAD WITHOUT CONTRAST TECHNIQUE: Multiplanar, multiecho pulse sequences of the brain and surrounding structures were  obtained without intravenous contrast. Angiographic images of the Circle of Willis were obtained using MRA technique without intravenous contrast. Angiographic images of the neck were obtained using MRA technique without and with intravenous contrast. Carotid stenosis measurements (when applicable) are obtained utilizing NASCET criteria, using the distal internal carotid diameter as the denominator. CONTRAST:  62m MULTIHANCE GADOBENATE DIMEGLUMINE 529 MG/ML IV SOLN COMPARISON:  Prior CT from earlier the same day. FINDINGS: MRI HEAD FINDINGS Cerebral volume within normal limits for patient age. Few scattered T2/FLAIR hyperintense foci noted within the periventricular and deep white matter of the left cerebral hemisphere. Most prominent of these foci in noted within the left periatrial white matter node and measures 1 cm (series 8, image 14). These foci are nonspecific. Two adjacent foci of encephalomalacia within the inferior left cerebellar hemisphere suspected to be related to remote ischemia (series 7, image 6). No abnormal foci of restricted diffusion to suggest acute or subacute ischemia. Gray-white matter differentiation otherwise maintained. No other evidence for chronic infarction. Single tiny foci of susceptibility artifact within the mid pons noted, consistent with a small chronic microhemorrhage (series 9, image 9). No other evidence for acute or chronic intracranial hemorrhage. No mass lesion, midline shift, or mass effect. No hydrocephalus. No extra-axial fluid collection. Major dural sinuses are grossly patent. Incidental note made of a partially empty sella. Pituitary gland suprasellar region otherwise unremarkable. Midline structures intact and normal. Major intracranial vascular flow voids are maintained. Craniocervical junction within normal limits. Visualized upper cervical spine unremarkable. Bone marrow signal intensity within normal limits. No scalp soft tissue abnormality. Globes and orbital  soft tissues within normal limits. Paranasal sinuses are clear. No mastoid effusion. Inner ear structures grossly normal. MRA HEAD FINDINGS ANTERIOR CIRCULATION: Distal cervical segments of the internal carotid arteries are widely patent with antegrade flow. Petrous, cavernous, and supraclinoid segments widely patent bilaterally without stenosis. Origin of the ophthalmic arteries patent. ICA termini widely patent. A1 segments widely patent. Anterior cerebral arteries widely patent to their distal aspects. M1 segments patent without stenosis or occlusion. MCA bifurcations normal. No proximal M2 occlusion. Distal MCA branches well opacified and symmetric parent POSTERIOR CIRCULATION: Vertebral  artery's patent to the vertebrobasilar junction without stenosis. Posterior inferior cerebral arteries patent proximally. Basilar artery widely patent. Superior cerebral arteries patent bilaterally. Both of the posterior cerebral arteries primarily supplied via the basilar and are widely patent to their distal aspects. Small bilateral posterior communicating arteries noted. No aneurysm or vascular malformation. MRV HEAD FINDINGS: The superior sagittal sinus widely patent. Torcula widely patent. Right transverse and sigmoid sinuses are widely patent and dominant. Proximal right internal jugular vein patent. Left transverse and sigmoid sinuses are hypoplastic but patent as well. Proximal left internal jugular vein patent. No obvious abrupt cut off to suggest dural sinus stenosis. Straight sinus, vein of Galen, and internal cerebral veins patent bilaterally. No obvious cortical vein thrombosis. The MRA NECK FINDINGS Source images reviewed. Visualized aortic arch of normal caliber with normal branch pattern. No high-grade flow-limiting stenosis about the origin of the great vessels. Visualized subclavian arteries widely patent. Right common and internal carotid artery's are widely patent from their origins to the skullbase without  stenosis or occlusion. No significant atheromatous irregularity or narrowing about the right carotid bifurcation. Left common and internal carotid arteries are widely patent from their origins to the skullbase without stenosis or occlusion. No significant atheromatous irregularity or narrowing about the left carotid bifurcation. Both of the vertebral arteries arise from the subclavian arteries. Vertebral arteries are largely code dominant. Vertebral arteries widely patent within the neck without stenosis or occlusion. IMPRESSION: MRI HEAD IMPRESSION: 1. No acute intracranial process identified. 2. Few patchy T2/FLAIR hyperintense foci involving the periventricular and deep white matter of the left cerebral hemisphere as above, somewhat advanced for age. These foci are nonspecific. Primary differential considerations include accelerated chronic small vessel ischemic disease, sequelae of migrainous disorder, vasculitis, or other prior infectious or inflammatory process. Although not classic for demyelinating disease, this is not entirely excluded, and could be considered in the correct clinical setting. 3. 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 4. Otherwise normal brain MRI. MRA HEAD IMPRESSION: Normal intracranial MRA. MRV HEAD IMPRESSION: Normal intracranial MRV. MRA NECK IMPRESSION: Normal MRA of the neck. Electronically Signed   By: Jeannine Boga M.D.   On: 11/02/2016 22:41   Ct Head Code Stroke W/o Cm  Result Date: 11/02/2016 CLINICAL DATA:  Code stroke. Left-sided numbness. Onset 45 minutes prior to admission EXAM: CT HEAD WITHOUT CONTRAST TECHNIQUE: Contiguous axial images were obtained from the base of the skull through the vertex without intravenous contrast. COMPARISON:  None. FINDINGS: Brain: No evidence of acute infarction, hemorrhage, hydrocephalus, extra-axial collection or mass lesion/mass effect.  Vascular: No hyperdense vessel or unexpected calcification. Skull: Negative Sinuses/Orbits: Negative Other: None ASPECTS ([facility] Stroke Program Early CT Score) - Ganglionic level infarction (caudate, lentiform nuclei, internal capsule, insula, M1-M3 cortex): 7 - Supraganglionic infarction (M4-M6 cortex): 3 Total score (0-10 with 10 being normal): 10 IMPRESSION: 1. Negative CT Head 2. ASPECTS is 10 These results were called by telephone at the time of interpretation on 11/02/2016 at 6:20 pm to Dr. Quentin Cornwall, who verbally acknowledged these results. Electronically Signed   By: Franchot Gallo M.D.   On: 11/02/2016 18:21    Medications:  I have reviewed the patient's current medications. Scheduled: . diphenhydrAMINE  25 mg Oral Once  . enoxaparin (LOVENOX) injection  40 mg Subcutaneous BID  . predniSONE  60 mg Oral Q breakfast    Assessment/Plan: Headache not completely resolved.  Some residual weakness as well.  ESR elevated  at 45.  CRP elevated at 1.5.  EEG normal.    Recommendations: 1.  Prednisone 46m daily for one week, then decrease by 171mdaily until off.   2.  F/U with neurology in 1-2 weeks 3.  ESR and CRP to be followed up on an outpatient basis 4.  Continue ASA 8159maily 5.  May use Motrin 800m42m8 hours prn for pain    LOS: 0 days   Lesl[facility] [phone]  5:42 PM

## 2016-11-04 NOTE — Discharge Summary (Addendum)
[facility] NAME: Denise Peterson    MR#:  [id number]  DATE OF BIRTH:  [birth year]  DATE OF ADMISSION:  11/02/2016 ADMITTING PHYSICIAN: Lance Coon, MD  DATE OF DISCHARGE: 11/04/2016  PRIMARY CARE PHYSICIAN: Patient, No Pcp Per    ADMISSION DIAGNOSIS:  CVA (cerebral vascular accident) ([place]) [I63.9] Left-sided weakness [R53.1] Cerebrovascular accident (CVA), unspecified mechanism ([place]) [I63.9]  DISCHARGE DIAGNOSIS:  Principal Problem:   Migraines Active Problems:   Left-sided weakness   SECONDARY DIAGNOSIS:   Past Medical History:  Diagnosis Date  . Migraine     HOSPITAL COURSE:   *complicated migraine  stroke suspected but ruled out  - Patient has an area of her cerebellum which shows potential old lesion, though this is in the setting of a normal MRA. MRI does not show acute stroke, and certainly does not show stroke in any region that would correspond to her current symptoms.  Appreciated a neurology consult, and try and treat her headache, which could be an atypical migraine with neurologic manifestations, as below.  *Migraines - patient received treatment with fioricet, given one dose dilaudid- but had nausea.   Started on oxycodone. And given one dose benadryl.   Her ESR and CRP are slightly high, so Neurologist suggested to have prednisone. And motrin as needed.  DISCHARGE CONDITIONS:   Stable.  CONSULTS OBTAINED:  Treatment Team:  Alexis Goodell, MD  DRUG ALLERGIES:  No Known Allergies  DISCHARGE MEDICATIONS:   Current Discharge Medication List    START taking these medications   Details  ibuprofen (MOTRIN IB) 200 MG tablet Take 3 tablets (600 mg total) by mouth every 6 (six) hours as needed for headache. Qty: 50 tablet, Refills: 0    ondansetron (ZOFRAN) 4 MG tablet Take 1 tablet (4 mg total) by mouth every 8 (eight) hours as needed for nausea or vomiting. Qty: 20 tablet, Refills:  0    predniSONE (DELTASONE) 20 MG tablet Take 3 tablets (60 mg total) by mouth daily with breakfast. Qty: 21 tablet, Refills: 0    predniSONE (STERAPRED UNI-PAK 21 TAB) 10 MG (21) TBPK tablet Take 6 tabs first day, 5 tab on day 2, then 4 on day 3rd, 3 tabs on day 4th , 2 tab on day 5th, and 1 tab on 6th day. Qty: 21 tablet, Refills: 0    zolpidem (AMBIEN) 5 MG tablet Take 1 tablet (5 mg total) by mouth at bedtime as needed for sleep. Qty: 7 tablet, Refills: 0      STOP taking these medications     cyclobenzaprine (FLEXERIL) 10 MG tablet      HYDROcodone-acetaminophen (NORCO/VICODIN) 5-325 MG per tablet          DISCHARGE INSTRUCTIONS:    Follow in neurology clinic in 2 weeks.  If you experience worsening of your admission symptoms, develop shortness of breath, life threatening emergency, suicidal or homicidal thoughts you must seek medical attention immediately by calling 911 or calling your MD immediately  if symptoms less severe.  You Must read complete instructions/literature along with all the possible adverse reactions/side effects for all the Medicines you take and that have been prescribed to you. Take any new Medicines after you have completely understood and accept all the possible adverse reactions/side effects.   Please note  You were cared for by a hospitalist during your hospital stay. If you have any questions about your discharge medications or the care you received while you were  in the hospital after you are discharged, you can call the unit and asked to speak with the hospitalist on call if the hospitalist that took care of you is not available. Once you are discharged, your primary care physician will handle any further medical issues. Please note that NO REFILLS for any discharge medications will be authorized once you are discharged, as it is imperative that you return to your primary care physician (or establish a relationship with a primary care physician if  you do not have one) for your aftercare needs so that they can reassess your need for medications and monitor your lab values.    Today   CHIEF COMPLAINT:   Chief Complaint  Patient presents with  . Code Stroke    HISTORY OF PRESENT ILLNESS:  Denise Peterson  is a 36 y.o. female presents with Left hemisensory deficit. Patient states that her left side feels heavy, like it is "asleep". Here in the ED patient got an MRI which did not show any acute pathology, but it did show a region of her cerebellum concerning for possible remote stroke. MRA and MRV were normal. Patient doesn't history of migraines, states that she did have a headache on the left side of her head today. Her systems are somewhat persistent here in the ED, improving some when she got migraine treatment medications. Hospitalists were called for admission  VITAL SIGNS:  Blood pressure (!) 106/49, pulse 66, temperature 98 F (36.7 C), temperature source Oral, resp. rate 14, height '5\' 7"'  (1.702 m), weight 126.1 kg (278 lb), last menstrual period 10/21/2016, SpO2 98 %.  I/O:    Intake/Output Summary (Last 24 hours) at 11/04/16 1606 Last data filed at 11/04/16 1353  Gross per 24 hour  Intake              360 ml  Output                0 ml  Net              360 ml    PHYSICAL EXAMINATION:   GENERAL:  36 y.o.-year-old patient lying in the bed with no acute distress.  EYES: Pupils equal, round, reactive to light and accommodation. No scleral icterus. Extraocular muscles intact.  HEENT: Head atraumatic, normocephalic. Oropharynx and nasopharynx clear.  NECK:  Supple, no jugular venous distention. No thyroid enlargement, no tenderness.  LUNGS: Normal breath sounds bilaterally, no wheezing, rales,rhonchi or crepitation. No use of accessory muscles of respiration.  CARDIOVASCULAR: S1, S2 normal. No murmurs, rubs, or gallops.  ABDOMEN: Soft, nontender, nondistended. Bowel sounds present. No organomegaly or mass.   EXTREMITIES: No pedal edema, cyanosis, or clubbing.  NEUROLOGIC: Cranial nerves II through XII are intact. Muscle strength 5/5 in right sided extremities and 4/5 on left side upper and lower extremities. Sensation intact. Gait not checked.  PSYCHIATRIC: The patient is alert and oriented x 3.  SKIN: No obvious rash, lesion, or ulcer.   DATA REVIEW:   CBC  Recent Labs Lab 11/03/16 0126  WBC 9.3  HGB 12.5  HCT 35.3  PLT 277    Chemistries   Recent Labs Lab 11/02/16 1816 11/03/16 0126  NA 138  --   K 3.7  --   CL 104  --   CO2 25  --   GLUCOSE 103*  --   BUN 9  --   CREATININE 0.80 0.75  CALCIUM 9.2  --   AST 28  --   ALT 28  --  ALKPHOS 59  --   BILITOT 0.4  --     Cardiac Enzymes  Recent Labs Lab 11/02/16 1816  TROPONINI <0.03    Microbiology Results  No results found for this or any previous visit.  RADIOLOGY:  Dg Chest 2 View  Result Date: 11/03/2016 CLINICAL DATA:  36 year old female with left side weakness since yesterday. EXAM: CHEST  2 VIEW COMPARISON:  Brain MRI 11/02/2016. FINDINGS: Low normal lung volumes. Normal cardiac size and mediastinal contours. Visualized tracheal air column is within normal limits. No pneumothorax, pulmonary edema, pleural effusion or confluent pulmonary opacity. Cholecystectomy clips in the right upper quadrant. Negative visible bowel gas pattern. No acute osseous abnormality identified. IMPRESSION: Negative.  No acute cardiopulmonary abnormality. Electronically Signed   By: Genevie Ann M.D.   On: 11/03/2016 08:02   Mr Jodene Nam Head Wo Contrast  Result Date: 11/02/2016 CLINICAL DATA:  Initial evaluation for left-sided weakness and numbness with intermittent left-sided visual loss, left-sided headache. EXAM: MRI HEAD WITHOUT CONTRAST MRA HEAD WITHOUT CONTRAST MRA NECK WITHOUT AND WITH CONTRAST MRV HEAD WITHOUT CONTRAST TECHNIQUE: Multiplanar, multiecho pulse sequences of the brain and surrounding structures were obtained without  intravenous contrast. Angiographic images of the Circle of Willis were obtained using MRA technique without intravenous contrast. Angiographic images of the neck were obtained using MRA technique without and with intravenous contrast. Carotid stenosis measurements (when applicable) are obtained utilizing NASCET criteria, using the distal internal carotid diameter as the denominator. CONTRAST:  25m MULTIHANCE GADOBENATE DIMEGLUMINE 529 MG/ML IV SOLN COMPARISON:  Prior CT from earlier the same day. FINDINGS: MRI HEAD FINDINGS Cerebral volume within normal limits for patient age. Few scattered T2/FLAIR hyperintense foci noted within the periventricular and deep white matter of the left cerebral hemisphere. Most prominent of these foci in noted within the left periatrial white matter node and measures 1 cm (series 8, image 14). These foci are nonspecific. Two adjacent foci of encephalomalacia within the inferior left cerebellar hemisphere suspected to be related to remote ischemia (series 7, image 6). No abnormal foci of restricted diffusion to suggest acute or subacute ischemia. Gray-white matter differentiation otherwise maintained. No other evidence for chronic infarction. Single tiny foci of susceptibility artifact within the mid pons noted, consistent with a small chronic microhemorrhage (series 9, image 9). No other evidence for acute or chronic intracranial hemorrhage. No mass lesion, midline shift, or mass effect. No hydrocephalus. No extra-axial fluid collection. Major dural sinuses are grossly patent. Incidental note made of a partially empty sella. Pituitary gland suprasellar region otherwise unremarkable. Midline structures intact and normal. Major intracranial vascular flow voids are maintained. Craniocervical junction within normal limits. Visualized upper cervical spine unremarkable. Bone marrow signal intensity within normal limits. No scalp soft tissue abnormality. Globes and orbital soft tissues  within normal limits. Paranasal sinuses are clear. No mastoid effusion. Inner ear structures grossly normal. MRA HEAD FINDINGS ANTERIOR CIRCULATION: Distal cervical segments of the internal carotid arteries are widely patent with antegrade flow. Petrous, cavernous, and supraclinoid segments widely patent bilaterally without stenosis. Origin of the ophthalmic arteries patent. ICA termini widely patent. A1 segments widely patent. Anterior cerebral arteries widely patent to their distal aspects. M1 segments patent without stenosis or occlusion. MCA bifurcations normal. No proximal M2 occlusion. Distal MCA branches well opacified and symmetric parent POSTERIOR CIRCULATION: Vertebral artery's patent to the vertebrobasilar junction without stenosis. Posterior inferior cerebral arteries patent proximally. Basilar artery widely patent. Superior cerebral arteries patent bilaterally. Both of the posterior cerebral arteries primarily supplied via  the basilar and are widely patent to their distal aspects. Small bilateral posterior communicating arteries noted. No aneurysm or vascular malformation. MRV HEAD FINDINGS: The superior sagittal sinus widely patent. Torcula widely patent. Right transverse and sigmoid sinuses are widely patent and dominant. Proximal right internal jugular vein patent. Left transverse and sigmoid sinuses are hypoplastic but patent as well. Proximal left internal jugular vein patent. No obvious abrupt cut off to suggest dural sinus stenosis. Straight sinus, vein of Galen, and internal cerebral veins patent bilaterally. No obvious cortical vein thrombosis. The MRA NECK FINDINGS Source images reviewed. Visualized aortic arch of normal caliber with normal branch pattern. No high-grade flow-limiting stenosis about the origin of the great vessels. Visualized subclavian arteries widely patent. Right common and internal carotid artery's are widely patent from their origins to the skullbase without stenosis or  occlusion. No significant atheromatous irregularity or narrowing about the right carotid bifurcation. Left common and internal carotid arteries are widely patent from their origins to the skullbase without stenosis or occlusion. No significant atheromatous irregularity or narrowing about the left carotid bifurcation. Both of the vertebral arteries arise from the subclavian arteries. Vertebral arteries are largely code dominant. Vertebral arteries widely patent within the neck without stenosis or occlusion. IMPRESSION: MRI HEAD IMPRESSION: 1. No acute intracranial process identified. 2. Few patchy T2/FLAIR hyperintense foci involving the periventricular and deep white matter of the left cerebral hemisphere as above, somewhat advanced for age. These foci are nonspecific. Primary differential considerations include accelerated chronic small vessel ischemic disease, sequelae of migrainous disorder, vasculitis, or other prior infectious or inflammatory process. Although not classic for demyelinating disease, this is not entirely excluded, and could be considered in the correct clinical setting. 3. 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 4. Otherwise normal brain MRI. MRA HEAD IMPRESSION: Normal intracranial MRA. MRV HEAD IMPRESSION: Normal intracranial MRV. MRA NECK IMPRESSION: Normal MRA of the neck. Electronically Signed   By: Jeannine Boga M.D.   On: 11/02/2016 22:41   Mr Angiogram Neck W Or Wo Contrast  Result Date: 11/02/2016 CLINICAL DATA:  Initial evaluation for left-sided weakness and numbness with intermittent left-sided visual loss, left-sided headache. EXAM: MRI HEAD WITHOUT CONTRAST MRA HEAD WITHOUT CONTRAST MRA NECK WITHOUT AND WITH CONTRAST MRV HEAD WITHOUT CONTRAST TECHNIQUE: Multiplanar, multiecho pulse sequences of the brain and surrounding structures were obtained without intravenous contrast.  Angiographic images of the Circle of Willis were obtained using MRA technique without intravenous contrast. Angiographic images of the neck were obtained using MRA technique without and with intravenous contrast. Carotid stenosis measurements (when applicable) are obtained utilizing NASCET criteria, using the distal internal carotid diameter as the denominator. CONTRAST:  67m MULTIHANCE GADOBENATE DIMEGLUMINE 529 MG/ML IV SOLN COMPARISON:  Prior CT from earlier the same day. FINDINGS: MRI HEAD FINDINGS Cerebral volume within normal limits for patient age. Few scattered T2/FLAIR hyperintense foci noted within the periventricular and deep white matter of the left cerebral hemisphere. Most prominent of these foci in noted within the left periatrial white matter node and measures 1 cm (series 8, image 14). These foci are nonspecific. Two adjacent foci of encephalomalacia within the inferior left cerebellar hemisphere suspected to be related to remote ischemia (series 7, image 6). No abnormal foci of restricted diffusion to suggest acute or subacute ischemia. Gray-white matter differentiation otherwise maintained. No other evidence for chronic infarction. Single tiny foci of susceptibility artifact within the mid pons noted, consistent with a  small chronic microhemorrhage (series 9, image 9). No other evidence for acute or chronic intracranial hemorrhage. No mass lesion, midline shift, or mass effect. No hydrocephalus. No extra-axial fluid collection. Major dural sinuses are grossly patent. Incidental note made of a partially empty sella. Pituitary gland suprasellar region otherwise unremarkable. Midline structures intact and normal. Major intracranial vascular flow voids are maintained. Craniocervical junction within normal limits. Visualized upper cervical spine unremarkable. Bone marrow signal intensity within normal limits. No scalp soft tissue abnormality. Globes and orbital soft tissues within normal limits.  Paranasal sinuses are clear. No mastoid effusion. Inner ear structures grossly normal. MRA HEAD FINDINGS ANTERIOR CIRCULATION: Distal cervical segments of the internal carotid arteries are widely patent with antegrade flow. Petrous, cavernous, and supraclinoid segments widely patent bilaterally without stenosis. Origin of the ophthalmic arteries patent. ICA termini widely patent. A1 segments widely patent. Anterior cerebral arteries widely patent to their distal aspects. M1 segments patent without stenosis or occlusion. MCA bifurcations normal. No proximal M2 occlusion. Distal MCA branches well opacified and symmetric parent POSTERIOR CIRCULATION: Vertebral artery's patent to the vertebrobasilar junction without stenosis. Posterior inferior cerebral arteries patent proximally. Basilar artery widely patent. Superior cerebral arteries patent bilaterally. Both of the posterior cerebral arteries primarily supplied via the basilar and are widely patent to their distal aspects. Small bilateral posterior communicating arteries noted. No aneurysm or vascular malformation. MRV HEAD FINDINGS: The superior sagittal sinus widely patent. Torcula widely patent. Right transverse and sigmoid sinuses are widely patent and dominant. Proximal right internal jugular vein patent. Left transverse and sigmoid sinuses are hypoplastic but patent as well. Proximal left internal jugular vein patent. No obvious abrupt cut off to suggest dural sinus stenosis. Straight sinus, vein of Galen, and internal cerebral veins patent bilaterally. No obvious cortical vein thrombosis. The MRA NECK FINDINGS Source images reviewed. Visualized aortic arch of normal caliber with normal branch pattern. No high-grade flow-limiting stenosis about the origin of the great vessels. Visualized subclavian arteries widely patent. Right common and internal carotid artery's are widely patent from their origins to the skullbase without stenosis or occlusion. No  significant atheromatous irregularity or narrowing about the right carotid bifurcation. Left common and internal carotid arteries are widely patent from their origins to the skullbase without stenosis or occlusion. No significant atheromatous irregularity or narrowing about the left carotid bifurcation. Both of the vertebral arteries arise from the subclavian arteries. Vertebral arteries are largely code dominant. Vertebral arteries widely patent within the neck without stenosis or occlusion. IMPRESSION: MRI HEAD IMPRESSION: 1. No acute intracranial process identified. 2. Few patchy T2/FLAIR hyperintense foci involving the periventricular and deep white matter of the left cerebral hemisphere as above, somewhat advanced for age. These foci are nonspecific. Primary differential considerations include accelerated chronic small vessel ischemic disease, sequelae of migrainous disorder, vasculitis, or other prior infectious or inflammatory process. Although not classic for demyelinating disease, this is not entirely excluded, and could be considered in the correct clinical setting. 3. 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 4. Otherwise normal brain MRI. MRA HEAD IMPRESSION: Normal intracranial MRA. MRV HEAD IMPRESSION: Normal intracranial MRV. MRA NECK IMPRESSION: Normal MRA of the neck. Electronically Signed   By: Jeannine Boga M.D.   On: 11/02/2016 22:41   Mr Brain Wo Contrast  Result Date: 11/02/2016 CLINICAL DATA:  Initial evaluation for left-sided weakness and numbness with intermittent left-sided visual loss, left-sided headache. EXAM: MRI HEAD WITHOUT CONTRAST MRA HEAD WITHOUT  CONTRAST MRA NECK WITHOUT AND WITH CONTRAST MRV HEAD WITHOUT CONTRAST TECHNIQUE: Multiplanar, multiecho pulse sequences of the brain and surrounding structures were obtained without intravenous contrast. Angiographic images of the Circle  of Willis were obtained using MRA technique without intravenous contrast. Angiographic images of the neck were obtained using MRA technique without and with intravenous contrast. Carotid stenosis measurements (when applicable) are obtained utilizing NASCET criteria, using the distal internal carotid diameter as the denominator. CONTRAST:  42m MULTIHANCE GADOBENATE DIMEGLUMINE 529 MG/ML IV SOLN COMPARISON:  Prior CT from earlier the same day. FINDINGS: MRI HEAD FINDINGS Cerebral volume within normal limits for patient age. Few scattered T2/FLAIR hyperintense foci noted within the periventricular and deep white matter of the left cerebral hemisphere. Most prominent of these foci in noted within the left periatrial white matter node and measures 1 cm (series 8, image 14). These foci are nonspecific. Two adjacent foci of encephalomalacia within the inferior left cerebellar hemisphere suspected to be related to remote ischemia (series 7, image 6). No abnormal foci of restricted diffusion to suggest acute or subacute ischemia. Gray-white matter differentiation otherwise maintained. No other evidence for chronic infarction. Single tiny foci of susceptibility artifact within the mid pons noted, consistent with a small chronic microhemorrhage (series 9, image 9). No other evidence for acute or chronic intracranial hemorrhage. No mass lesion, midline shift, or mass effect. No hydrocephalus. No extra-axial fluid collection. Major dural sinuses are grossly patent. Incidental note made of a partially empty sella. Pituitary gland suprasellar region otherwise unremarkable. Midline structures intact and normal. Major intracranial vascular flow voids are maintained. Craniocervical junction within normal limits. Visualized upper cervical spine unremarkable. Bone marrow signal intensity within normal limits. No scalp soft tissue abnormality. Globes and orbital soft tissues within normal limits. Paranasal sinuses are clear. No mastoid  effusion. Inner ear structures grossly normal. MRA HEAD FINDINGS ANTERIOR CIRCULATION: Distal cervical segments of the internal carotid arteries are widely patent with antegrade flow. Petrous, cavernous, and supraclinoid segments widely patent bilaterally without stenosis. Origin of the ophthalmic arteries patent. ICA termini widely patent. A1 segments widely patent. Anterior cerebral arteries widely patent to their distal aspects. M1 segments patent without stenosis or occlusion. MCA bifurcations normal. No proximal M2 occlusion. Distal MCA branches well opacified and symmetric parent POSTERIOR CIRCULATION: Vertebral artery's patent to the vertebrobasilar junction without stenosis. Posterior inferior cerebral arteries patent proximally. Basilar artery widely patent. Superior cerebral arteries patent bilaterally. Both of the posterior cerebral arteries primarily supplied via the basilar and are widely patent to their distal aspects. Small bilateral posterior communicating arteries noted. No aneurysm or vascular malformation. MRV HEAD FINDINGS: The superior sagittal sinus widely patent. Torcula widely patent. Right transverse and sigmoid sinuses are widely patent and dominant. Proximal right internal jugular vein patent. Left transverse and sigmoid sinuses are hypoplastic but patent as well. Proximal left internal jugular vein patent. No obvious abrupt cut off to suggest dural sinus stenosis. Straight sinus, vein of Galen, and internal cerebral veins patent bilaterally. No obvious cortical vein thrombosis. The MRA NECK FINDINGS Source images reviewed. Visualized aortic arch of normal caliber with normal branch pattern. No high-grade flow-limiting stenosis about the origin of the great vessels. Visualized subclavian arteries widely patent. Right common and internal carotid artery's are widely patent from their origins to the skullbase without stenosis or occlusion. No significant atheromatous irregularity or narrowing  about the right carotid bifurcation. Left common and internal carotid arteries are widely patent from their origins to the skullbase without stenosis or occlusion.  No significant atheromatous irregularity or narrowing about the left carotid bifurcation. Both of the vertebral arteries arise from the subclavian arteries. Vertebral arteries are largely code dominant. Vertebral arteries widely patent within the neck without stenosis or occlusion. IMPRESSION: MRI HEAD IMPRESSION: 1. No acute intracranial process identified. 2. Few patchy T2/FLAIR hyperintense foci involving the periventricular and deep white matter of the left cerebral hemisphere as above, somewhat advanced for age. These foci are nonspecific. Primary differential considerations include accelerated chronic small vessel ischemic disease, sequelae of migrainous disorder, vasculitis, or other prior infectious or inflammatory process. Although not classic for demyelinating disease, this is not entirely excluded, and could be considered in the correct clinical setting. 3. 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 4. Otherwise normal brain MRI. MRA HEAD IMPRESSION: Normal intracranial MRA. MRV HEAD IMPRESSION: Normal intracranial MRV. MRA NECK IMPRESSION: Normal MRA of the neck. Electronically Signed   By: Jeannine Boga M.D.   On: 11/02/2016 22:41   Mr Mrv Head Wo Cm  Result Date: 11/02/2016 CLINICAL DATA:  Initial evaluation for left-sided weakness and numbness with intermittent left-sided visual loss, left-sided headache. EXAM: MRI HEAD WITHOUT CONTRAST MRA HEAD WITHOUT CONTRAST MRA NECK WITHOUT AND WITH CONTRAST MRV HEAD WITHOUT CONTRAST TECHNIQUE: Multiplanar, multiecho pulse sequences of the brain and surrounding structures were obtained without intravenous contrast. Angiographic images of the Circle of Willis were obtained using MRA technique without  intravenous contrast. Angiographic images of the neck were obtained using MRA technique without and with intravenous contrast. Carotid stenosis measurements (when applicable) are obtained utilizing NASCET criteria, using the distal internal carotid diameter as the denominator. CONTRAST:  94m MULTIHANCE GADOBENATE DIMEGLUMINE 529 MG/ML IV SOLN COMPARISON:  Prior CT from earlier the same day. FINDINGS: MRI HEAD FINDINGS Cerebral volume within normal limits for patient age. Few scattered T2/FLAIR hyperintense foci noted within the periventricular and deep white matter of the left cerebral hemisphere. Most prominent of these foci in noted within the left periatrial white matter node and measures 1 cm (series 8, image 14). These foci are nonspecific. Two adjacent foci of encephalomalacia within the inferior left cerebellar hemisphere suspected to be related to remote ischemia (series 7, image 6). No abnormal foci of restricted diffusion to suggest acute or subacute ischemia. Gray-white matter differentiation otherwise maintained. No other evidence for chronic infarction. Single tiny foci of susceptibility artifact within the mid pons noted, consistent with a small chronic microhemorrhage (series 9, image 9). No other evidence for acute or chronic intracranial hemorrhage. No mass lesion, midline shift, or mass effect. No hydrocephalus. No extra-axial fluid collection. Major dural sinuses are grossly patent. Incidental note made of a partially empty sella. Pituitary gland suprasellar region otherwise unremarkable. Midline structures intact and normal. Major intracranial vascular flow voids are maintained. Craniocervical junction within normal limits. Visualized upper cervical spine unremarkable. Bone marrow signal intensity within normal limits. No scalp soft tissue abnormality. Globes and orbital soft tissues within normal limits. Paranasal sinuses are clear. No mastoid effusion. Inner ear structures grossly normal. MRA  HEAD FINDINGS ANTERIOR CIRCULATION: Distal cervical segments of the internal carotid arteries are widely patent with antegrade flow. Petrous, cavernous, and supraclinoid segments widely patent bilaterally without stenosis. Origin of the ophthalmic arteries patent. ICA termini widely patent. A1 segments widely patent. Anterior cerebral arteries widely patent to their distal aspects. M1 segments patent without stenosis or occlusion. MCA bifurcations normal. No proximal M2 occlusion. Distal MCA branches well opacified and  symmetric parent POSTERIOR CIRCULATION: Vertebral artery's patent to the vertebrobasilar junction without stenosis. Posterior inferior cerebral arteries patent proximally. Basilar artery widely patent. Superior cerebral arteries patent bilaterally. Both of the posterior cerebral arteries primarily supplied via the basilar and are widely patent to their distal aspects. Small bilateral posterior communicating arteries noted. No aneurysm or vascular malformation. MRV HEAD FINDINGS: The superior sagittal sinus widely patent. Torcula widely patent. Right transverse and sigmoid sinuses are widely patent and dominant. Proximal right internal jugular vein patent. Left transverse and sigmoid sinuses are hypoplastic but patent as well. Proximal left internal jugular vein patent. No obvious abrupt cut off to suggest dural sinus stenosis. Straight sinus, vein of Galen, and internal cerebral veins patent bilaterally. No obvious cortical vein thrombosis. The MRA NECK FINDINGS Source images reviewed. Visualized aortic arch of normal caliber with normal branch pattern. No high-grade flow-limiting stenosis about the origin of the great vessels. Visualized subclavian arteries widely patent. Right common and internal carotid artery's are widely patent from their origins to the skullbase without stenosis or occlusion. No significant atheromatous irregularity or narrowing about the right carotid bifurcation. Left common  and internal carotid arteries are widely patent from their origins to the skullbase without stenosis or occlusion. No significant atheromatous irregularity or narrowing about the left carotid bifurcation. Both of the vertebral arteries arise from the subclavian arteries. Vertebral arteries are largely code dominant. Vertebral arteries widely patent within the neck without stenosis or occlusion. IMPRESSION: MRI HEAD IMPRESSION: 1. No acute intracranial process identified. 2. Few patchy T2/FLAIR hyperintense foci involving the periventricular and deep white matter of the left cerebral hemisphere as above, somewhat advanced for age. These foci are nonspecific. Primary differential considerations include accelerated chronic small vessel ischemic disease, sequelae of migrainous disorder, vasculitis, or other prior infectious or inflammatory process. Although not classic for demyelinating disease, this is not entirely excluded, and could be considered in the correct clinical setting. 3. 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 4. Otherwise normal brain MRI. MRA HEAD IMPRESSION: Normal intracranial MRA. MRV HEAD IMPRESSION: Normal intracranial MRV. MRA NECK IMPRESSION: Normal MRA of the neck. Electronically Signed   By: Jeannine Boga M.D.   On: 11/02/2016 22:41   Ct Head Code Stroke W/o Cm  Result Date: 11/02/2016 CLINICAL DATA:  Code stroke. Left-sided numbness. Onset 45 minutes prior to admission EXAM: CT HEAD WITHOUT CONTRAST TECHNIQUE: Contiguous axial images were obtained from the base of the skull through the vertex without intravenous contrast. COMPARISON:  None. FINDINGS: Brain: No evidence of acute infarction, hemorrhage, hydrocephalus, extra-axial collection or mass lesion/mass effect. Vascular: No hyperdense vessel or unexpected calcification. Skull: Negative Sinuses/Orbits: Negative Other: None ASPECTS  ([place] Stroke Program Early CT Score) - Ganglionic level infarction (caudate, lentiform nuclei, internal capsule, insula, M1-M3 cortex): 7 - Supraganglionic infarction (M4-M6 cortex): 3 Total score (0-10 with 10 being normal): 10 IMPRESSION: 1. Negative CT Head 2. ASPECTS is 10 These results were called by telephone at the time of interpretation on 11/02/2016 at 6:20 pm to Dr. Quentin Cornwall, who verbally acknowledged these results. Electronically Signed   By: Franchot Gallo M.D.   On: 11/02/2016 18:21    EKG:   Orders placed or performed during the hospital encounter of 11/02/16  . ED EKG  . ED EKG      Management plans discussed with the patient, family and they are in agreement.  CODE STATUS:     Code Status Orders  Start     Ordered   11/03/16 0059  Full code  Continuous     11/03/16 0058    Code Status History    Date Active Date Inactive Code Status Order ID Comments User Context   This patient has a current code status but no historical code status.      TOTAL TIME TAKING CARE OF THIS PATIENT: 35 minutes.    Vaughan Basta M.D on 11/04/2016 at 4:06 PM  Between 7am to 6pm - Pager - [phone]  After 6pm go to [email or web address] - password [email or web address] [facility]  Office  [phone]  CC: Primary care physician; Patient, No Pcp Per   Note: This dictation was prepared with Dragon dictation along with smaller phrase technology. Any transcriptional errors that result from this process are unintentional.

## 2017-01-19 ENCOUNTER — Emergency Department
Admission: EM | Admit: 2017-01-19 | Discharge: 2017-01-19 | Disposition: A | Discharge status: Home / Self Care | Payer: Self-pay | Attending: Emergency Medicine | Admitting: Emergency Medicine

## 2017-01-19 ENCOUNTER — Emergency Department: Payer: Self-pay

## 2017-01-19 ENCOUNTER — Encounter: Payer: Self-pay | Admitting: Emergency Medicine

## 2017-01-19 DIAGNOSIS — Z87891 Personal history of nicotine dependence: Secondary | ICD-10-CM | POA: Insufficient documentation

## 2017-01-19 DIAGNOSIS — G43909 Migraine, unspecified, not intractable, without status migrainosus: Secondary | ICD-10-CM | POA: Insufficient documentation

## 2017-01-19 MED ORDER — DIPHENHYDRAMINE HCL 50 MG/ML IJ SOLN
50.0000 mg | Freq: Once | INTRAMUSCULAR | Status: AC
  Administered 2017-01-19: 50 mg via INTRAVENOUS
  Filled 2017-01-19: qty 1

## 2017-01-19 MED ORDER — PROCHLORPERAZINE EDISYLATE 5 MG/ML IJ SOLN
10.0000 mg | Freq: Once | INTRAMUSCULAR | Status: AC
  Administered 2017-01-19: 10 mg via INTRAVENOUS
  Filled 2017-01-19: qty 2

## 2017-01-19 MED ORDER — KETOROLAC TROMETHAMINE 30 MG/ML IJ SOLN
15.0000 mg | Freq: Once | INTRAMUSCULAR | Status: AC
  Administered 2017-01-19: 15 mg via INTRAVENOUS
  Filled 2017-01-19: qty 1

## 2017-01-19 MED ORDER — SODIUM CHLORIDE 0.9 % IV BOLUS (SEPSIS)
1000.0000 mL | Freq: Once | INTRAVENOUS | Status: AC
  Administered 2017-01-19: 1000 mL via INTRAVENOUS

## 2017-01-19 NOTE — Discharge Instructions (Signed)
Please keep your follow-up with your neurologist tomorrow at 10 in the morning as scheduled and return to the emergency department sooner for any concerns.  It was a pleasure to take care of you today, and thank you for coming to our emergency department.  If you have any questions or concerns before leaving please ask the nurse to grab me and I'm more than happy to go through your aftercare instructions again.  If you were prescribed any opioid pain medication today such as Norco, Vicodin, Percocet, morphine, hydrocodone, or oxycodone please make sure you do not drive when you are taking this medication as it can alter your ability to drive safely.  If you have any concerns once you are home that you are not improving or are in fact getting worse before you can make it to your follow-up appointment, please do not hesitate to call [phone] and come back for further evaluation.  Merrily BrittleNeil Jkayla Spiewak, MD  Results for orders placed or performed during the hospital encounter of 11/02/16  Protime-INR  Result Value Ref Range   Prothrombin Time 12.4 11.4 - 15.2 seconds   INR 0.92   APTT  Result Value Ref Range   aPTT 26 24 - 36 seconds  CBC  Result Value Ref Range   WBC 13.4 (H) 3.6 - 11.0 K/uL   RBC 4.39 3.80 - 5.20 MIL/uL   Hemoglobin 13.9 12.0 - 16.0 g/dL   HCT 16.140.4 09.635.0 - 04.547.0 %   MCV 91.9 80.0 - 100.0 fL   MCH 31.6 26.0 - 34.0 pg   MCHC 34.3 32.0 - 36.0 g/dL   RDW 40.913.3 81.111.5 - 91.414.5 %   Platelets 332 150 - 440 K/uL  Differential  Result Value Ref Range   Neutrophils Relative % 56 %   Neutro Abs 7.3 (H) 1.4 - 6.5 K/uL   Lymphocytes Relative 33 %   Lymphs Abs 4.5 (H) 1.0 - 3.6 K/uL   Monocytes Relative 7 %   Monocytes Absolute 1.0 (H) 0.2 - 0.9 K/uL   Eosinophils Relative 3 %   Eosinophils Absolute 0.5 0 - 0.7 K/uL   Basophils Relative 1 %   Basophils Absolute 0.2 (H) 0 - 0.1 K/uL  Comprehensive metabolic panel  Result Value Ref Range   Sodium 138 135 - 145 mmol/L   Potassium 3.7 3.5 - 5.1  mmol/L   Chloride 104 101 - 111 mmol/L   CO2 25 22 - 32 mmol/L   Glucose, Bld 103 (H) 65 - 99 mg/dL   BUN 9 6 - 20 mg/dL   Creatinine, Ser 7.820.80 0.44 - 1.00 mg/dL   Calcium 9.2 8.9 - 95.610.3 mg/dL   Total Protein 8.0 6.5 - 8.1 g/dL   Albumin 4.0 3.5 - 5.0 g/dL   AST 28 15 - 41 U/L   ALT 28 14 - 54 U/L   Alkaline Phosphatase 59 38 - 126 U/L   Total Bilirubin 0.4 0.3 - 1.2 mg/dL   GFR calc non Af Amer >60 >60 mL/min   GFR calc Af Amer >60 >60 mL/min   Anion gap 9 5 - 15  Troponin I  Result Value Ref Range   Troponin I <0.03 <0.03 ng/mL  hCG, quantitative, pregnancy  Result Value Ref Range   hCG, Beta Chain, Quant, S <1 <5 mIU/mL  Glucose, capillary  Result Value Ref Range   Glucose-Capillary 107 (H) 65 - 99 mg/dL  HIV antibody (Routine Testing)  Result Value Ref Range   HIV Screen 4th Generation  wRfx Non Reactive Non Reactive  Hemoglobin A1c  Result Value Ref Range   Hgb A1c MFr Bld 5.4 4.8 - 5.6 %   Mean Plasma Glucose 108 mg/dL  Lipid panel  Result Value Ref Range   Cholesterol 140 0 - 200 mg/dL   Triglycerides 36 <960 mg/dL   HDL 35 (L) >45 mg/dL   Total CHOL/HDL Ratio 4.0 RATIO   VLDL 7 0 - 40 mg/dL   LDL Cholesterol 98 0 - 99 mg/dL  CBC  Result Value Ref Range   WBC 9.3 3.6 - 11.0 K/uL   RBC 3.79 (L) 3.80 - 5.20 MIL/uL   Hemoglobin 12.5 12.0 - 16.0 g/dL   HCT 40.9 81.1 - 91.4 %   MCV 93.0 80.0 - 100.0 fL   MCH 33.0 26.0 - 34.0 pg   MCHC 35.5 32.0 - 36.0 g/dL   RDW 78.2 95.6 - 21.3 %   Platelets 277 150 - 440 K/uL  Creatinine, serum  Result Value Ref Range   Creatinine, Ser 0.75 0.44 - 1.00 mg/dL   GFR calc non Af Amer >60 >60 mL/min   GFR calc Af Amer >60 >60 mL/min  Sedimentation rate  Result Value Ref Range   Sed Rate 45 (H) 0 - 20 mm/hr  C-reactive protein  Result Value Ref Range   CRP 1.5 (H) <1.0 mg/dL  ECHOCARDIOGRAM COMPLETE  Result Value Ref Range   Weight 4,448 oz   Height 67 in   BP 113/58 mmHg   Ct Head Wo Contrast  Result Date:  01/19/2017 CLINICAL DATA:  36 year old female with numbness, tingling, paresthesia to the left body. Left side headache. EXAM: CT HEAD WITHOUT CONTRAST TECHNIQUE: Contiguous axial images were obtained from the base of the skull through the vertex without intravenous contrast. COMPARISON:  Head CT, brain MRI, MRA, and MRV 11/02/2016. FINDINGS: Brain: Cerebral volume remains normal. No midline shift, ventriculomegaly, mass effect, evidence of mass lesion, intracranial hemorrhage or evidence of cortically based acute infarction. Partially empty sella. Stable gray-white matter differentiation throughout the brain; there are small chronic infarcts in the inferior left cerebellum. Supratentorial gray-white differentiation remains normal. Vascular: No suspicious intracranial vascular hyperdensity. Skull: Stable and negative. Sinuses/Orbits: Bilateral anterior ethmoid, left sphenoid, and maxillary sinus mucosal thickening and/or opacification is new since July. Tympanic cavities and mastoids remain clear. Other: Visualized orbit soft tissues are within normal limits. No acute scalp soft tissue findings. IMPRESSION: 1. New bilateral paranasal sinus inflammation. 2. No other acute intracranial abnormality. 3. 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 Electronically Signed   By: Odessa Fleming M.D.   On: 01/19/2017 15:35

## 2017-01-19 NOTE — ED Provider Notes (Signed)
[facility] Emergency Department Provider Note  ____________________________________________   First MD Initiated Contact with Patient 01/19/17 1624     (approximate)  I have reviewed the triage vital signs and the nursing notes.   HISTORY  Chief Complaint Migraine and Numbness   HPI Denise Peterson is a 36 y.o. female who self presents to the emergency department with roughly 48 hours of gradual onset not maximal onset left sided throbbing headache that feels similar to previous migraine headaches but also numbness and weakness in her left and that is progressively becoming more proximal. In July of this year she was admitted to our hospital for complex migraine and was evaluated for possible stroke. She is subsequently been followed by her neurologist Dr. Sherryll Burger who feels that she actually may have had a stroke at some point, however her symptoms at the time of admission were secondary to complex migraine. She takes magnesium at night to help prevent migraines however she does not have any migraine medication. Her symptoms had a gradual onset and have been slowly progressive. Nothing seems to make them better or worse. They are associated with photophobia. Are associated with nausea but no vomiting. No fevers or chills. No chest pain or shortness of breath.   Past Medical History:  Diagnosis Date  . Migraine     Patient Active Problem List   Diagnosis Date Noted  . Left-sided weakness   . Migraines 11/02/2016    Past Surgical History:  Procedure Laterality Date  . CESAREAN SECTION    . CHOLECYSTECTOMY      Prior to Admission medications   Medication Sig Start Date End Date Taking? Authorizing Provider  predniSONE (STERAPRED UNI-PAK 21 TAB) 10 MG (21) TBPK tablet Take 6 tabs first day, 5 tab on day 2, then 4 on day 3rd, 3 tabs on day 4th , 2 tab on day 5th, and 1 tab on 6th day. 11/13/16   Altamese Dilling, MD  zolpidem (AMBIEN) 5 MG tablet  Take 1 tablet (5 mg total) by mouth at bedtime as needed for sleep. 11/04/16 11/11/16  Altamese Dilling, MD    Allergies Patient has no known allergies.  No family history on file.  Social History Social History  Substance Use Topics  . Smoking status: Former Games developer  . Smokeless tobacco: Never Used  . Alcohol use Yes     Comment: Social alcohol use    Review of Systems Constitutional: No fever/chills Eyes: No visual changes. ENT: No sore throat. Cardiovascular: Denies chest pain. Respiratory: Denies shortness of breath. Gastrointestinal: No abdominal pain.  No nausea, no vomiting.  No diarrhea.  No constipation. Genitourinary: Negative for dysuria. Musculoskeletal: Negative for back pain. Skin: Negative for rash. Neurological: positive for headache and focal numbness and weakness   ____________________________________________   PHYSICAL EXAM:  VITAL SIGNS: ED Triage Vitals  Enc Vitals Group     BP 01/19/17 1507 126/83     Pulse Rate 01/19/17 1507 65     Resp 01/19/17 1507 16     Temp 01/19/17 1507 97.8 F (36.6 C)     Temp Source 01/19/17 1507 Oral     SpO2 --      Weight 01/19/17 1505 270 lb (122.5 kg)     Height 01/19/17 1505 5\' 7"  (1.702 m)     Head Circumference --      Peak Flow --      Pain Score 01/19/17 1505 8     Pain Loc --  Pain Edu? --      Excl. in GC? --     Constitutional: alert and oriented 4 pleasant cooperative speaks in full clear sentences sitting in a dark room shelter in her eyes Eyes: PERRL EOMI. Head: Atraumatic. Nose: No congestion/rhinnorhea. Mouth/Throat: No trismus Neck: No stridor.  no meningismus Cardiovascular: Normal rate, regular rhythm. Grossly normal heart sounds.  Good peripheral circulation. Respiratory: Normal respiratory effort.  No retractions. Lungs CTAB and moving good air Gastrointestinal: soft nontender Musculoskeletal: No lower extremity edema   Neurologic:  Normal speech and language. N cranial  nerves II through XII intact Pronator drift on the left Decreased grip strength in the left hand 5 out of 5 biceps triceps hip flexion and extension plantar flexion dorsiflexion bilaterally Subjective decreased sensation left hand Skin:  Skin is warm, dry and intact. No rash noted. Psychiatric: Mood and affect are normal. Speech and behavior are normal.    ____________________________________________   DIFFERENTIAL includes but not limited to  complex migraine, stroke, TIA, meningitis ____________________________________________   LABS (all labs ordered are listed, but only abnormal results are displayed)  Labs Reviewed - No data to display   __________________________________________  EKG   ____________________________________________  RADIOLOGY   ____________________________________________   PROCEDURES  Procedure(s) performed: no  Procedures  Critical Care performed: no  Observation: no ____________________________________________   INITIAL IMPRESSION / ASSESSMENT AND PLAN / ED COURSE  Pertinent labs & imaging results that were available during my care of the patient were reviewed by me and considered in my medical decision making (see chart for details).  The patient arrives with a gradual onset headache that feels similar to previous migraine headaches. She does have notable neurologic deficits with the pronator drift and decreased grip strength on the left, however this is identical to her previous symptoms when she had an MRI negative event. I will treat her symptomatically now with Compazine and Benadryl and Toradol and fluids and reevaluate.     ----------------------------------------- 6:05 PM on 01/19/2017 -----------------------------------------  The patient's headache and numbness are improved although not completely resolved. I offered the patient further treatment, however she declined stating she had an appointment to see her neurologist  tomorrow at 10 in the morning he would prefer to go home and sleep. I think this is entirely reasonable and I do not believe the patient had a stroke. At this point she is medically stable for outpatient management with follow-up as scheduled. Strict return precautions are given. ____________________________________________   FINAL CLINICAL IMPRESSION(S) / ED DIAGNOSES  Final diagnoses:  None      NEW MEDICATIONS STARTED DURING THIS VISIT:  New Prescriptions   No medications on file     Note:  This document was prepared using Dragon voice recognition software and may include unintentional dictation errors.     Merrily Brittleifenbark, Gilmore List, MD 01/19/17 [phone]

## 2017-01-19 NOTE — ED Triage Notes (Signed)
Arrives with C/O loss of sensation to left side, left sided head ache.  Patient was seen through ED for similar symptoms in July and was admitted for "complex Migraine".  Sent to ED by Dr. Sherryll BurgerShah for evaluation.  Symptoms onset 10/15.  Initially symptoms were only to left hand, but about 3 hours ago, symptoms worsened and left side is numb.  AAOx3.  Skin warm and dry. MAE equally and strong.  Gait steady. NAD

## 2017-04-10 ENCOUNTER — Other Ambulatory Visit: Payer: Self-pay

## 2017-04-10 ENCOUNTER — Encounter: Payer: Self-pay | Admitting: *Deleted

## 2017-04-10 ENCOUNTER — Ambulatory Visit
Admission: EM | Admit: 2017-04-10 | Discharge: 2017-04-10 | Disposition: A | Discharge status: Home / Self Care | Payer: Medicaid Other | Attending: Family Medicine | Admitting: Family Medicine

## 2017-04-10 DIAGNOSIS — J069 Acute upper respiratory infection, unspecified: Secondary | ICD-10-CM

## 2017-04-10 MED ORDER — AZITHROMYCIN 250 MG PO TABS: 250.0000 mg | ORAL_TABLET | Freq: Every day | ORAL | 0 refills | Status: DC

## 2017-04-10 MED ORDER — HYDROCOD POLST-CPM POLST ER 10-8 MG/5ML PO SUER: 5.0000 mL | Freq: Two times a day (BID) | ORAL | 0 refills | Status: DC

## 2017-04-10 MED ORDER — BENZONATATE 200 MG PO CAPS: ORAL_CAPSULE | ORAL | 0 refills | Status: DC

## 2017-04-10 NOTE — ED Provider Notes (Signed)
[facility]    CSN: [id number] Arrival date & time: 04/10/17  1344     History   Chief Complaint Chief Complaint  Patient presents with  . Cough  . Nasal Congestion    HPI Denise Peterson is a 37 y.o. female.   HPI  37 year old female presents with symptoms of cough nasal congestion and fever that she has had for over 2 weeks.  He just returned from a business trip to [facility].  2 days ago she developed laryngitis.  States that she seems to be worsening and progress as well her daughter who accompanies her has improved.  He does not have fevers currently.  O2 sats are 96% on room air.  Been taking over-the-counter medications which have not proved effective.         Past Medical History:  Diagnosis Date  . Migraine     Patient Active Problem List   Diagnosis Date Noted  . Left-sided weakness   . Migraines 11/02/2016    Past Surgical History:  Procedure Laterality Date  . CESAREAN SECTION    . CHOLECYSTECTOMY      OB History    No data available       Home Medications    Prior to Admission medications   Medication Sig Start Date End Date Taking? Authorizing Provider  cyanocobalamin 1000 MCG tablet Take 1,000 mcg by mouth daily.   Yes [provider]  Magnesium (MAGNACAPS) 100 MG CAPS Take by mouth.   Yes [provider]  topiramate (TOPAMAX) 50 MG tablet Take 50 mg by mouth 2 (two) times daily.   Yes [provider]  azithromycin (ZITHROMAX) 250 MG tablet Take 1 tablet (250 mg total) by mouth daily. Take first 2 tablets together, then 1 every day until finished. 04/10/17   Lutricia Feil, PA-C  benzonatate (TESSALON) 200 MG capsule Take one cap TID PRN cough 04/10/17   Lutricia Feil, PA-C  chlorpheniramine-HYDROcodone [facility]) 10-8 MG/5ML SUER Take 5 mLs by mouth 2 (two) times daily. 04/10/17   Lutricia Feil, PA-C    Family History Family History  Problem Relation Age of Onset  .  Healthy Mother     Social History Social History   Tobacco Use  . Smoking status: Former Games developer  . Smokeless tobacco: Never Used  Substance Use Topics  . Alcohol use: Yes    Comment: Social alcohol use  . Drug use: No     Allergies   Patient has no known allergies.   Review of Systems Review of Systems  Constitutional: Positive for activity change, chills, fatigue and fever.  HENT: Positive for congestion, postnasal drip, rhinorrhea and voice change.   Respiratory: Positive for cough and shortness of breath.   All other systems reviewed and are negative.    Physical Exam Triage Vital Signs ED Triage Vitals  Enc Vitals Group     BP 04/10/17 1409 117/90     Pulse Rate 04/10/17 1409 89     Resp 04/10/17 1409 16     Temp 04/10/17 1409 98.2 F (36.8 C)     Temp Source 04/10/17 1409 Oral     SpO2 04/10/17 1409 96 %     Weight 04/10/17 1410 260 lb (117.9 kg)     Height 04/10/17 1410 5\' 7"  (1.702 m)     Head Circumference --      Peak Flow --      Pain Score 04/10/17 1410 3  Pain Loc --      Pain Edu? --      Excl. in GC? --    No data found.  Updated Vital Signs BP 117/90 (BP Location: Left Arm)   Pulse 89   Temp 98.2 F (36.8 C) (Oral)   Resp 16   Ht 5\' 7"  (1.702 m)   Wt 260 lb (117.9 kg)   LMP 03/26/2017   SpO2 96%   BMI 40.72 kg/m   Visual Acuity Right Eye Distance:   Left Eye Distance:   Bilateral Distance:    Right Eye Near:   Left Eye Near:    Bilateral Near:     Physical Exam  Constitutional: She is oriented to person, place, and time. She appears well-developed and well-nourished. No distress.  HENT:  Head: Normocephalic.  Right Ear: External ear normal.  Left Ear: External ear normal.  Nose: Nose normal.  Mouth/Throat: Oropharynx is clear and moist. No oropharyngeal exudate.  Tonsil on the right is enlarged  Eyes: Pupils are equal, round, and reactive to light. Right eye exhibits no discharge. Left eye exhibits no discharge.    Neck: Normal range of motion.  Pulmonary/Chest: Effort normal and breath sounds normal.  Musculoskeletal: Normal range of motion.  Lymphadenopathy:    She has no cervical adenopathy.  Neurological: She is alert and oriented to person, place, and time.  Skin: Skin is warm and dry. She is not diaphoretic.  Psychiatric: She has a normal mood and affect. Her behavior is normal. Judgment and thought content normal.  Nursing note and vitals reviewed.    UC Treatments / Results  Labs (all labs ordered are listed, but only abnormal results are displayed) Labs Reviewed - No data to display  EKG  EKG Interpretation None       Radiology No results found.  Procedures Procedures (including critical care time)  Medications Ordered in UC Medications - No data to display   Initial Impression / Assessment and Plan / UC Course  I have reviewed the triage vital signs and the nursing notes.  Pertinent labs & imaging results that were available during my care of the patient were reviewed by me and considered in my medical decision making (see chart for details).     Plan: 1. Test/x-ray results and diagnosis reviewed with patient 2. rx as per orders; risks, benefits, potential side effects reviewed with patient 3. Recommend supportive treatment with rest and fluids.  We will start her on a Z-Pak because the length of time that she is had her illness and the symptoms and findings today.  In addition I will treat her cough symptomatically.  Also recommended the use of Flonase daily for the next 3-4 weeks.  If she is not improving she should follow-up with her primary care physician or return to our clinic. 4. F/u prn if symptoms worsen or don't improve   Final Clinical Impressions(s) / UC Diagnoses   Final diagnoses:  Upper respiratory tract infection, unspecified type    ED Discharge Orders        Ordered    azithromycin (ZITHROMAX) 250 MG tablet  Daily     04/10/17 1434     chlorpheniramine-HYDROcodone (TUSSIONEX PENNKINETIC ER) 10-8 MG/5ML SUER  2 times daily     04/10/17 1434    benzonatate (TESSALON) 200 MG capsule     04/10/17 1434       Controlled Substance Prescriptions [place] Controlled Substance Registry consulted? No   Lutricia FeilRoemer, William P, PA-C 04/10/17  1457  

## 2017-04-10 NOTE — ED Triage Notes (Signed)
Patient started having symptoms of cough, nasal congestion, and fever 2 weeks ago.

## 2017-08-24 ENCOUNTER — Other Ambulatory Visit: Payer: Self-pay

## 2017-08-24 ENCOUNTER — Emergency Department: Payer: Self-pay

## 2017-08-24 ENCOUNTER — Encounter: Payer: Self-pay | Admitting: Emergency Medicine

## 2017-08-24 DIAGNOSIS — R079 Chest pain, unspecified: Secondary | ICD-10-CM | POA: Insufficient documentation

## 2017-08-24 DIAGNOSIS — Z9049 Acquired absence of other specified parts of digestive tract: Secondary | ICD-10-CM | POA: Insufficient documentation

## 2017-08-24 DIAGNOSIS — Z87891 Personal history of nicotine dependence: Secondary | ICD-10-CM | POA: Insufficient documentation

## 2017-08-24 DIAGNOSIS — R0602 Shortness of breath: Secondary | ICD-10-CM | POA: Insufficient documentation

## 2017-08-24 DIAGNOSIS — Z79899 Other long term (current) drug therapy: Secondary | ICD-10-CM | POA: Insufficient documentation

## 2017-08-24 LAB — CBC
HEMATOCRIT: 41.6 % (ref 35.0–47.0)
HEMOGLOBIN: 14.6 g/dL (ref 12.0–16.0)
MCH: 32.7 pg (ref 26.0–34.0)
MCHC: 35.1 g/dL (ref 32.0–36.0)
MCV: 93.3 fL (ref 80.0–100.0)
Platelets: 357 10*3/uL (ref 150–440)
RBC: 4.45 MIL/uL (ref 3.80–5.20)
RDW: 13.2 % (ref 11.5–14.5)
WBC: 10.7 10*3/uL (ref 3.6–11.0)

## 2017-08-24 LAB — BASIC METABOLIC PANEL
ANION GAP: 8 (ref 5–15)
BUN: 10 mg/dL (ref 6–20)
CHLORIDE: 105 mmol/L (ref 101–111)
CO2: 24 mmol/L (ref 22–32)
Calcium: 9 mg/dL (ref 8.9–10.3)
Creatinine, Ser: 0.67 mg/dL (ref 0.44–1.00)
GFR calc Af Amer: >60 mL/min (ref >60)
GLUCOSE: 104 mg/dL — AB (ref 65–99)
POTASSIUM: 4 mmol/L (ref 3.5–5.1)
Sodium: 137 mmol/L (ref 135–145)

## 2017-08-24 LAB — TROPONIN I

## 2017-08-24 NOTE — ED Triage Notes (Signed)
Patient to ER for c/o chest pain and shortness of breath, as well as light-headedness that began approx one hour ago. States head is also starting to hurt on left posterior side. Patient has h/o mitral valve regurgitation. Patient also reports taking birth control pills. Patient states at time of shortness of breath starting, she was at band practice singing. States shortness of breath has since improved a little, but still feels "labor intensive". Patient able to speak in complete sentences, but appears short of breath.

## 2017-08-25 ENCOUNTER — Encounter: Payer: Self-pay | Admitting: Radiology

## 2017-08-25 ENCOUNTER — Emergency Department: Payer: Self-pay

## 2017-08-25 ENCOUNTER — Emergency Department
Admission: EM | Admit: 2017-08-25 | Discharge: 2017-08-25 | Disposition: A | Discharge status: Home / Self Care | Payer: Self-pay | Attending: Emergency Medicine | Admitting: Emergency Medicine

## 2017-08-25 DIAGNOSIS — R0602 Shortness of breath: Secondary | ICD-10-CM

## 2017-08-25 DIAGNOSIS — R079 Chest pain, unspecified: Secondary | ICD-10-CM

## 2017-08-25 HISTORY — DX: Nonrheumatic mitral (valve) insufficiency: I34.0

## 2017-08-25 LAB — TROPONIN I: Troponin I: <0.03 ng/mL (ref <0.03)

## 2017-08-25 MED ORDER — IOPAMIDOL (ISOVUE-370) INJECTION 76%
100.0000 mL | Freq: Once | INTRAVENOUS | Status: AC | PRN
  Administered 2017-08-25: 100 mL via INTRAVENOUS

## 2017-08-25 NOTE — ED Provider Notes (Signed)
[facility] Emergency Department Provider Note    First MD Initiated Contact with Patient 08/25/17 0022     (approximate)  I have reviewed the triage vital signs and the nursing notes.   HISTORY  Chief Complaint Shortness of Breath and Chest Pain    HPI Denise Peterson is a 37 y.o. female with below list of chronic medical conditions including "mitral regurgitation and notes 14 years ago" presents to the emergency department with acute onset of chest pain and dyspnea which began approximately 1 hour before arrival.  Patient states that she was at band practice singing when the symptoms began.  She states it has feels difficult to take a deep breath.  Patient states she felt like a "catch which is what usually occurs when she "feels her mitral regurg" patient denies any lower extremity pain or swelling.  Patient denies any history of DVT or PE.  Patient does currently take Ortho Tri-Cyclen Lo dose.   Past Medical History:  Diagnosis Date  . Migraine   . Mitral valve regurgitation     Patient Active Problem List   Diagnosis Date Noted  . Left-sided weakness   . Migraines 11/02/2016    Past Surgical History:  Procedure Laterality Date  . CESAREAN SECTION    . CHOLECYSTECTOMY      Prior to Admission medications   Medication Sig Start Date End Date Taking? Authorizing Provider  azithromycin (ZITHROMAX) 250 MG tablet Take 1 tablet (250 mg total) by mouth daily. Take first 2 tablets together, then 1 every day until finished. 04/10/17   Lutricia Feil, PA-C  benzonatate (TESSALON) 200 MG capsule Take one cap TID PRN cough 04/10/17   Lutricia Feil, PA-C  chlorpheniramine-HYDROcodone [facility]) 10-8 MG/5ML SUER Take 5 mLs by mouth 2 (two) times daily. 04/10/17   Lutricia Feil, PA-C  cyanocobalamin 1000 MCG tablet Take 1,000 mcg by mouth daily.    [provider]  Magnesium (MAGNACAPS) 100 MG CAPS Take by mouth.    [provider]  topiramate (TOPAMAX) 50 MG tablet Take 50 mg by mouth 2 (two) times daily.    [provider]    Allergies No known drug allergies  Family History  Problem Relation Age of Onset  . Healthy Mother     Social History Social History   Tobacco Use  . Smoking status: Former Games developer  . Smokeless tobacco: Never Used  Substance Use Topics  . Alcohol use: Yes    Comment: Social alcohol use  . Drug use: No    Review of Systems Constitutional: No fever/chills Eyes: No visual changes. ENT: No sore throat. Cardiovascular: Positive for chest pain. Respiratory: Positive for shortness of breath. Gastrointestinal: No abdominal pain.  No nausea, no vomiting.  No diarrhea.  No constipation. Genitourinary: Negative for dysuria. Musculoskeletal: Negative for neck pain.  Negative for back pain. Integumentary: Negative for rash. Neurological: Negative for headaches, focal weakness or numbness.  ____________________________________________   PHYSICAL EXAM:  VITAL SIGNS: ED Triage Vitals  Enc Vitals Group     BP 08/24/17 2113 133/82     Pulse Rate 08/24/17 2113 (!) 109     Resp 08/24/17 2113 20     Temp 08/24/17 2113 97.7 F (36.5 C)     Temp Source 08/24/17 2113 Oral     SpO2 08/24/17 2113 99 %     Weight 08/24/17 2114 120.2 kg (265 lb)     Height 08/24/17 2114 1.702  m ( )     Head Circumference --      Peak Flow --      Pain Score 08/24/17 2113 6     Pain Loc --      Pain Edu? --      Excl. in GC? --     Constitutional: Alert and oriented. Well appearing and in no acute distress. Eyes: Conjunctivae are normal.  Head: Atraumatic. Mouth/Throat: Mucous membranes are moist.  Oropharynx non-erythematous. Neck: No stridor.  Cardiovascular: Normal rate, regular rhythm. Good peripheral circulation. Grossly normal heart sounds. Respiratory: Normal respiratory effort.  No retractions. Lungs CTAB. Gastrointestinal: Soft and nontender. No distention.    Musculoskeletal: No lower extremity tenderness nor edema. No gross deformities of extremities. Neurologic:  Normal speech and language. No gross focal neurologic deficits are appreciated.  Skin:  Skin is warm, dry and intact. No rash noted. Psychiatric: Mood and affect are normal. Speech and behavior are normal.  ____________________________________________   LABS (all labs ordered are listed, but only abnormal results are displayed)  Labs Reviewed  BASIC METABOLIC PANEL - Abnormal; Notable for the following components:      Result Value   Glucose, Bld 104 (*)    All other components within normal limits  CBC  TROPONIN I   ____________________________________________  EKG  ED ECG REPORT I, [place] N BROWN, the attending physician, personally viewed and interpreted this ECG.   Date: 08/25/2017  EKG Time: 9:09 PM  Rate: 99  Rhythm: Normal sinus rhythm  Axis: Normal  Intervals: Normal  ST&T Change: None  ____________________________________________  RADIOLOGY I, [place] N BROWN, personally viewed and evaluated these images (plain radiographs) as part of my medical decision making, as well as reviewing the written report by the radiologist.  ED MD interpretation: No acute findings noted on chest x-ray or CT angio chest  Official radiology report(s): Dg Chest 2 View  Result Date: 08/24/2017 CLINICAL DATA:  Chest pain and short of breath EXAM: CHEST - 2 VIEW COMPARISON:  11/03/2016 FINDINGS: Hyperinflation with mild bronchitic changes. No acute focal airspace disease or effusion. Normal heart size. No pneumothorax. Surgical clips in the right upper quadrant IMPRESSION: No active cardiopulmonary disease. Low lung volumes with mild bronchitic changes Electronically Signed   By: Jasmine Pang M.D.   On: 08/24/2017 21:27   Ct Angio Chest Pe W And/or Wo Contrast  Result Date: 08/25/2017 CLINICAL DATA:  Chest pain and shortness of breath EXAM: CT ANGIOGRAPHY CHEST WITH CONTRAST  TECHNIQUE: Multidetector CT imaging of the chest was performed using the standard protocol during bolus administration of intravenous contrast. Multiplanar CT image reconstructions and MIPs were obtained to evaluate the vascular anatomy. CONTRAST:  ISOVUE-370 IOPAMIDOL (ISOVUE-370) INJECTION 76% COMPARISON:  Chest x-ray 08/24/2017 FINDINGS: Cardiovascular: Satisfactory opacification of the pulmonary arteries to the segmental level. No evidence of pulmonary embolism. Normal heart size. No pericardial effusion. Nonaneurysmal aorta. No dissection seen. Small amount of iatrogenic air in the pulmonary trunk. Mediastinum/Nodes: No enlarged mediastinal, hilar, or axillary lymph nodes. Thyroid gland, trachea, and esophagus demonstrate no significant findings. Lungs/Pleura: Lungs are clear. No pleural effusion or pneumothorax. Upper Abdomen: No acute abnormality. Surgical clips in the right upper quadrant. Musculoskeletal: No chest wall abnormality. No acute or significant osseous findings. Review of the MIP images confirms the above findings. IMPRESSION: Negative for acute pulmonary embolus or aortic dissection. Clear lung fields. Electronically Signed   By: Jasmine Pang M.D.   On: 08/25/2017 02:31  Procedures   ____________________________________________   INITIAL IMPRESSION / ASSESSMENT AND PLAN / ED COURSE  As part of my medical decision making, I reviewed the following data within the electronic MEDICAL RECORD NUMBER  37 year old female presenting with above-stated history and physical exam with chest pain shortness of breath.  Consider the possibility of ACS versus pulmonary emboli.  EKG revealed no evidence of ischemia or infarction.  Laboratory data unremarkable including troponin x2.  CT scan of the chest revealed no evidence of pulmonary emboli.  Review of the patient's chart revealed an echocardiogram from 2018 revealed no evidence of mitral regurgitation.  Spoke with the patient at length  about all clinical findings and will recommend outpatient cardiology follow-up ____________________________________________  FINAL CLINICAL IMPRESSION(S) / ED DIAGNOSES  Final diagnoses:  Chest pain, unspecified type  Shortness of breath     MEDICATIONS GIVEN DURING THIS VISIT:  Medications  iopamidol (ISOVUE-370) 76 % injection 100 mL (100 mLs Intravenous Contrast Given 08/25/17 0206)     ED Discharge Orders    None       Note:  This document was prepared using Dragon voice recognition software and may include unintentional dictation errors.    Darci Current, MD 08/25/17 [phone]

## 2018-04-21 ENCOUNTER — Encounter: Payer: Self-pay | Admitting: Emergency Medicine

## 2018-04-21 ENCOUNTER — Ambulatory Visit
Admission: EM | Admit: 2018-04-21 | Discharge: 2018-04-21 | Disposition: A | Discharge status: Home / Self Care | Payer: BLUE CROSS/BLUE SHIELD | Attending: Family Medicine | Admitting: Family Medicine

## 2018-04-21 ENCOUNTER — Other Ambulatory Visit: Payer: Self-pay

## 2018-04-21 DIAGNOSIS — Z87891 Personal history of nicotine dependence: Secondary | ICD-10-CM

## 2018-04-21 DIAGNOSIS — J029 Acute pharyngitis, unspecified: Secondary | ICD-10-CM | POA: Diagnosis not present

## 2018-04-21 DIAGNOSIS — J020 Streptococcal pharyngitis: Secondary | ICD-10-CM | POA: Diagnosis not present

## 2018-04-21 DIAGNOSIS — H9203 Otalgia, bilateral: Secondary | ICD-10-CM

## 2018-04-21 LAB — RAPID STREP SCREEN (MED CTR MEBANE ONLY): Streptococcus, Group A Screen (Direct): POSITIVE — AB

## 2018-04-21 MED ORDER — LIDOCAINE VISCOUS HCL 2 % MT SOLN: 10.0000 mL | Freq: Four times a day (QID) | OROMUCOSAL | 0 refills | Status: AC | PRN

## 2018-04-21 MED ORDER — AMOXICILLIN 875 MG PO TABS: 875.0000 mg | ORAL_TABLET | Freq: Two times a day (BID) | ORAL | 0 refills | Status: AC

## 2018-04-21 NOTE — ED Provider Notes (Signed)
[facility] ____________________________________________  Time seen: Approximately 9:13 AM  I have reviewed the triage vital signs and the nursing notes.   HISTORY  Chief Complaint Sore Throat and Otalgia   HPI Denise Peterson is a 38 y.o. female presenting for evaluation of sore throat and bilateral ear discomfort present since this morning.  States last night she felt some sore throat but very mild.  States moderate to severe sore throat today.  States has felt feverish as well.  Has taken over-the-counter Tylenol and ibuprofen without resolution.  Denies recent sickness.  States minimal postnasal drainage.  Denies cough.  Denies known direct sick contacts.  Denies other aggravating alleviating factors.  Denies chest pain, shortness of breath or abdominal pain.  Reports otherwise doing well.  Jeannette Corpus, MD: PCP Patient's last menstrual period was 04/06/2018 (exact date).  Denies pregnancy    Past Medical History:  Diagnosis Date  . Migraine   . Mitral valve regurgitation     Patient Active Problem List   Diagnosis Date Noted  . Left-sided weakness   . Migraines 11/02/2016    Past Surgical History:  Procedure Laterality Date  . CESAREAN SECTION    . CHOLECYSTECTOMY       No current facility-administered medications for this encounter.   Current Outpatient Medications:  .  Norgestimate-Ethinyl Estradiol Triphasic (TRI-SPRINTEC) 0.18/0.215/0.25 MG-35 MCG tablet, TAKE 1 TABLET BY MOUTH DAILY AT THE SAME TIME, Disp: , Rfl:  .  amoxicillin (AMOXIL) 875 MG tablet, Take 1 tablet (875 mg total) by mouth 2 (two) times daily., Disp: 20 tablet, Rfl: 0 .  lidocaine (XYLOCAINE) 2 % solution, Use as directed 10 mLs in the mouth or throat every 6 (six) hours as needed (sore throat. gargle and spit as needed for sore throat.)., Disp: 100 mL, Rfl: 0  Allergies Patient has no known allergies.  Family History  Problem Relation Age of Onset  . Healthy  Mother     Social History Social History   Tobacco Use  . Smoking status: Former Games developer  . Smokeless tobacco: Never Used  Substance Use Topics  . Alcohol use: Yes    Comment: Social alcohol use  . Drug use: No    Review of Systems Constitutional: Positive subjective fever ENT: As above Cardiovascular: Denies chest pain. Respiratory: Denies shortness of breath. Gastrointestinal: No abdominal pain.   Musculoskeletal: Negative for back pain. Skin: Negative for rash.  ____________________________________________   PHYSICAL EXAM:  VITAL SIGNS: ED Triage Vitals  Enc Vitals Group     BP 04/21/18 0826 108/79     Pulse Rate 04/21/18 0826 98     Resp 04/21/18 0826 16     Temp 04/21/18 0826 98.2 F (36.8 C)     Temp Source 04/21/18 0826 Oral     SpO2 04/21/18 0826 99 %     Weight 04/21/18 0822 260 lb (117.9 kg)     Height 04/21/18 0822 5\' 7"  (1.702 m)     Head Circumference --      Peak Flow --      Pain Score 04/21/18 0822 4     Pain Loc --      Pain Edu? --      Excl. in GC? --     Constitutional: Alert and oriented. Well appearing and in no acute distress. Eyes: Conjunctivae are normal.  Head: Atraumatic. No sinus tenderness to palpation. No swelling. No erythema.  Ears: no erythema, normal TMs bilaterally.   Nose:No nasal congestion  Mouth/Throat: Mucous membranes are moist.moderate pharyngeal erythema.  Mild bilateral tonsillar swelling.  No exudate. Neck: No stridor.  No cervical spine tenderness to palpation. Hematological/Lymphatic/Immunilogical: Anterior bilateral cervical lymphadenopathy. Cardiovascular: Normal rate, regular rhythm. Grossly normal heart sounds.  Good peripheral circulation. Respiratory: Normal respiratory effort.  No retractions. No wheezes, rales or rhonchi. Good air movement.  Musculoskeletal: Ambulatory with steady gait.  Neurologic:  Normal speech and language. No gait instability. Skin:  Skin appears warm, dry and intact. No rash  noted. Psychiatric: Mood and affect are normal. Speech and behavior are normal. ___________________________________________   LABS (all labs ordered are listed, but only abnormal results are displayed)  Labs Reviewed  RAPID STREP SCREEN ([facility] ONLY) - Abnormal; Notable for the following components:      Result Value   Streptococcus, Group A Screen (Direct) POSITIVE (*)    All other components within normal limits   ____________________________________________    PROCEDURES  INITIAL IMPRESSION / ASSESSMENT AND PLAN / ED COURSE  Pertinent labs & imaging results that were available during my care of the patient were reviewed by me and considered in my medical decision making (see chart for details).  Well-appearing patient.  No acute distress.  Strep positive.  Will treat with oral amoxicillin.  PRN viscous lidocaine, continue over-the-counter ibuprofen and Tylenol as needed peer encourage rest, fluids and supportive care.Discussed indication, risks and benefits of medications with patient.  Discussed follow up with Primary care physician this week. Discussed follow up and return parameters including no resolution or any worsening concerns. Patient verbalized understanding and agreed to plan.   ____________________________________________   FINAL CLINICAL IMPRESSION(S) / ED DIAGNOSES  Final diagnoses:  Strep pharyngitis     ED Discharge Orders         Ordered    amoxicillin (AMOXIL) 875 MG tablet  2 times daily     04/21/18 0846    lidocaine (XYLOCAINE) 2 % solution  Every 6 hours PRN     04/21/18 0846           Note: This dictation was prepared with Dragon dictation along with smaller phrase technology. Any transcriptional errors that result from this process are unintentional.         Renford DillsMiller, Sherlonda Flater, NP 04/21/18 [phone]

## 2018-04-21 NOTE — ED Triage Notes (Signed)
Patient c/o sore throat and bilateral ear pressure that started this morning.

## 2018-04-21 NOTE — Discharge Instructions (Addendum)
Take medication as prescribed. Rest. Drink plenty of fluids.  ° °Follow up with your primary care physician this week as needed. Return to Urgent care for new or worsening concerns.  ° °

## 2019-04-30 ENCOUNTER — Other Ambulatory Visit: Payer: Self-pay

## 2019-04-30 ENCOUNTER — Ambulatory Visit
Admission: EM | Admit: 2019-04-30 | Discharge: 2019-04-30 | Disposition: A | Discharge status: Home / Self Care | Payer: BLUE CROSS/BLUE SHIELD | Attending: Family Medicine | Admitting: Family Medicine

## 2019-04-30 DIAGNOSIS — J029 Acute pharyngitis, unspecified: Secondary | ICD-10-CM | POA: Diagnosis not present

## 2019-04-30 DIAGNOSIS — Z87891 Personal history of nicotine dependence: Secondary | ICD-10-CM

## 2019-04-30 DIAGNOSIS — R6883 Chills (without fever): Secondary | ICD-10-CM

## 2019-04-30 DIAGNOSIS — Z20822 Contact with and (suspected) exposure to covid-19: Secondary | ICD-10-CM | POA: Diagnosis not present

## 2019-04-30 LAB — GROUP A STREP BY PCR: Group A Strep by PCR: NOT DETECTED

## 2019-04-30 NOTE — ED Triage Notes (Addendum)
Pt states she started today with sore throat and chills. Feels like she does when she gets strep. Pt has a new baby in daycare. Would like a strep test

## 2019-04-30 NOTE — ED Provider Notes (Signed)
[facility]    CSN: [id number] Arrival date & time: 04/30/19  1425      History   Chief Complaint Chief Complaint  Patient presents with  . Sore Throat  . Chills    HPI Denise Peterson is a 39 y.o. female.   39 yo female with a c/o sore throat and chills that started today. States symptoms feel like prior strep infections. Denies any fevers, cough, congestion, shortness of breath.      Past Medical History:  Diagnosis Date  . Migraine   . Mitral valve regurgitation     Patient Active Problem List   Diagnosis Date Noted  . Left-sided weakness   . Migraines 11/02/2016    Past Surgical History:  Procedure Laterality Date  . CESAREAN SECTION    . CHOLECYSTECTOMY      OB History   No obstetric history on file.      Home Medications    Prior to Admission medications   Medication Sig Start Date End Date Taking? Authorizing Provider  etonogestrel (NEXPLANON) 68 MG IMPL implant 1 each by Subdermal route once.   Yes [provider]  amoxicillin (AMOXIL) 875 MG tablet Take 1 tablet (875 mg total) by mouth 2 (two) times daily. 04/21/18   Marylene Land, NP  lidocaine (XYLOCAINE) 2 % solution Use as directed 10 mLs in the mouth or throat every 6 (six) hours as needed (sore throat. gargle and spit as needed for sore throat.). 04/21/18   Marylene Land, NP  Norgestimate-Ethinyl Estradiol Triphasic (TRI-SPRINTEC) 0.18/0.215/0.25 MG-35 MCG tablet TAKE 1 TABLET BY MOUTH DAILY AT THE SAME TIME 07/29/17   [provider]    Family History Family History  Problem Relation Age of Onset  . Healthy Mother     Social History Social History   Tobacco Use  . Smoking status: Former Research scientist (life sciences)  . Smokeless tobacco: Never Used  Substance Use Topics  . Alcohol use: Yes    Comment: Social alcohol use  . Drug use: No     Allergies   Patient has no known allergies.   Review of Systems Review of Systems   Physical Exam Triage Vital  Signs ED Triage Vitals  Enc Vitals Group     BP 04/30/19 1446 124/85     Pulse Rate 04/30/19 1446 72     Resp 04/30/19 1446 16     Temp 04/30/19 1446 98.3 F (36.8 C)     Temp Source 04/30/19 1446 Oral     SpO2 04/30/19 1446 100 %     Weight 04/30/19 1445 260 lb (117.9 kg)     Height 04/30/19 1445 5\' 7"  (1.702 m)     Head Circumference --      Peak Flow --      Pain Score 04/30/19 1444 4     Pain Loc --      Pain Edu? --      Excl. in [place]? --    No data found.  Updated Vital Signs BP 124/85 (BP Location: Left Arm)   Pulse 72   Temp 98.3 F (36.8 C) (Oral)   Resp 16   Ht 5\' 7"  (1.702 m)   Wt 117.9 kg   LMP 04/26/2019   SpO2 100%   BMI 40.72 kg/m   Visual Acuity Right Eye Distance:   Left Eye Distance:   Bilateral Distance:    Right Eye Near:   Left Eye Near:    Bilateral Near:  Physical Exam Vitals and nursing note reviewed.  Constitutional:      General: She is not in acute distress.    Appearance: She is not toxic-appearing or diaphoretic.  HENT:     Mouth/Throat:     Pharynx: Posterior oropharyngeal erythema present. No oropharyngeal exudate.  Cardiovascular:     Rate and Rhythm: Normal rate.  Pulmonary:     Effort: Pulmonary effort is normal. No respiratory distress.  Neurological:     Mental Status: She is alert.      UC Treatments / Results  Labs (all labs ordered are listed, but only abnormal results are displayed) Labs Reviewed  GROUP A STREP BY PCR  NOVEL CORONAVIRUS, NAA (HOSP ORDER, SEND-OUT TO REF LAB; TAT 18-24 HRS)    EKG   Radiology No results found.  Procedures Procedures (including critical care time)  Medications Ordered in UC Medications - No data to display  Initial Impression / Assessment and Plan / UC Course  I have reviewed the triage vital signs and the nursing notes.  Pertinent labs & imaging results that were available during my care of the patient were reviewed by me and considered in my medical decision  making (see chart for details).      Final Clinical Impressions(s) / UC Diagnoses   Final diagnoses:  Sore throat     Discharge Instructions     Rest, fluids, salt water gargles, over the counter analgesics as needed    ED Prescriptions    None     1. Lab result and diagnosis reviewed with patient 2. Recommend supportive treatment as above 3. covid test done 4. Follow-up prn if symptoms worsen or don't improve  PDMP not reviewed this encounter.   Payton Mccallum, MD 05/02/19 [phone]

## 2019-04-30 NOTE — Discharge Instructions (Addendum)
Rest, fluids, salt water gargles, over the counter analgesics as needed

## 2019-05-01 LAB — NOVEL CORONAVIRUS, NAA (HOSP ORDER, SEND-OUT TO REF LAB; TAT 18-24 HRS): SARS-CoV-2, NAA: NOT DETECTED

## 2019-11-25 IMAGING — CR DG CHEST 2V
2 series · 2 of 2 positions shown · non-contrast
Comparison: 11/03/2016

CLINICAL DATA: Chest pain and short of breath

EXAM:
CHEST - 2 VIEW

[chest pa]
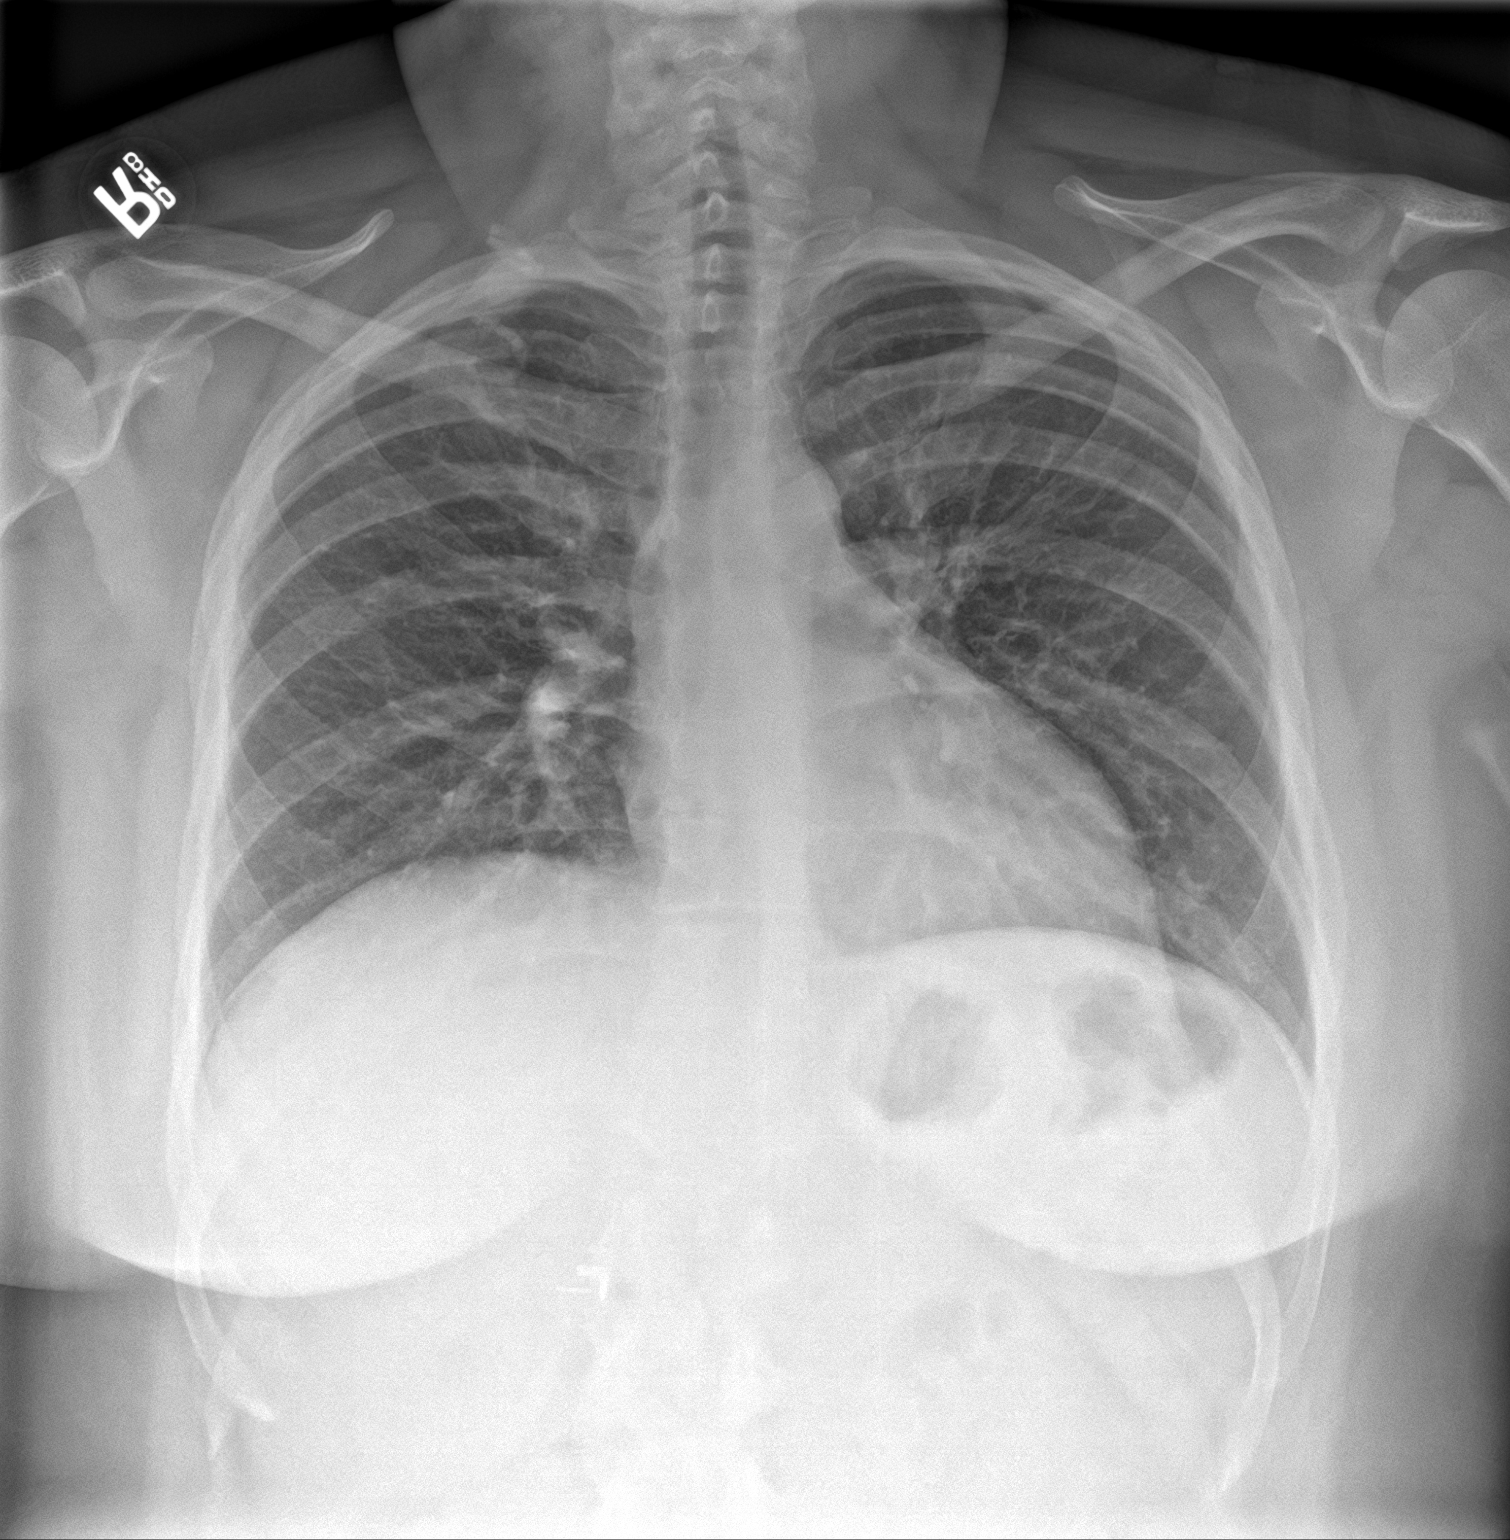

[chest lat]
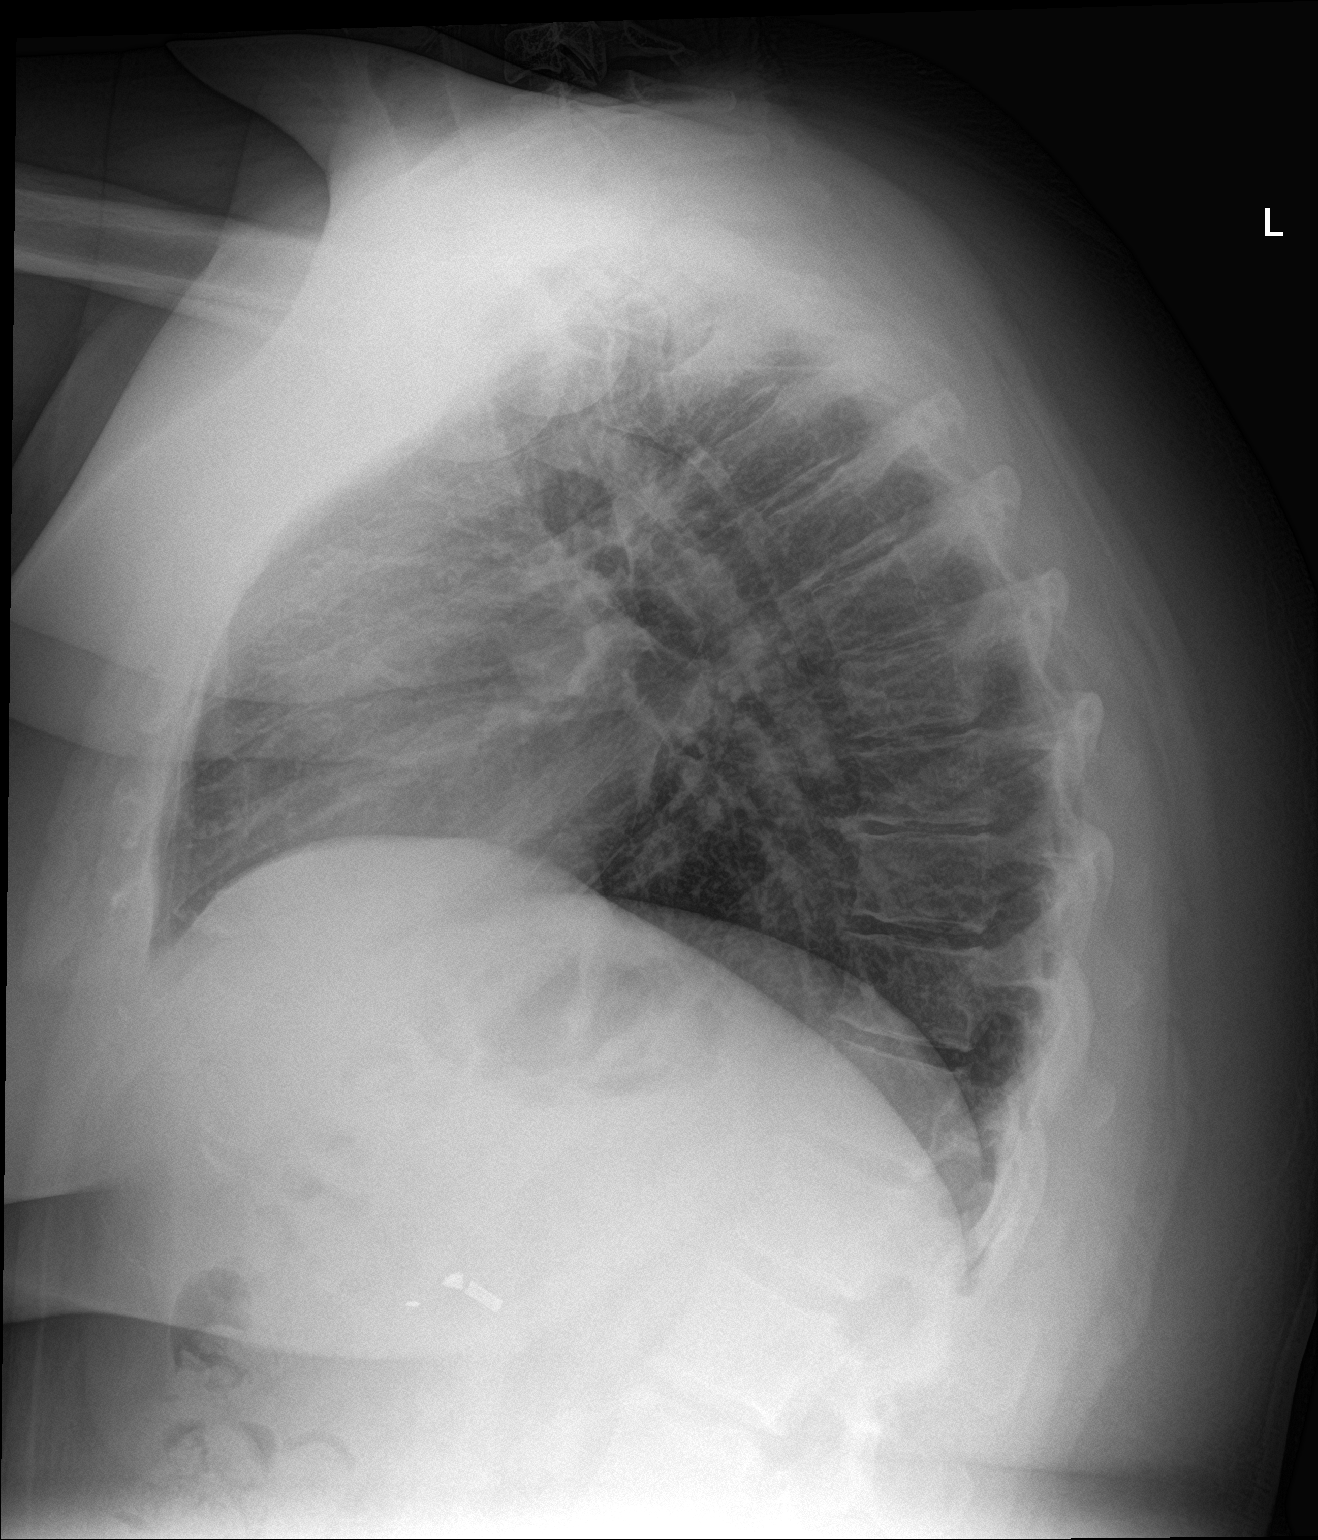

[2 of 2 positions shown; findings below may reference images not displayed]

FINDINGS: Hyperinflation with mild bronchitic changes. No acute focal airspace
disease or effusion. Normal heart size. No pneumothorax. Surgical
clips in the right upper quadrant
IMPRESSION: No active cardiopulmonary disease. Low lung volumes with mild
bronchitic changes

## 2019-11-26 IMAGING — CT CT ANGIO CHEST
2 of 6 series · 19 of 36 positions shown · IV contrast (APPLIED)
Comparison: Chest x-ray 08/24/2017

CLINICAL DATA: Chest pain and shortness of breath

EXAM:
CT ANGIOGRAPHY CHEST WITH CONTRAST
TECHNIQUE: Multidetector CT imaging of the chest was performed using the
standard protocol during bolus administration of intravenous
contrast. Multiplanar CT image reconstructions and MIPs were
obtained to evaluate the vascular anatomy.
CONTRAST:  100mL OIFKUR-JY8 IOPAMIDOL (OIFKUR-JY8) INJECTION 76%

[Series 5: thins · axial · 0.82mm/px · z∈[-391,-159]mm · 18 of 258 slices shown]
[im 13/258  lung]
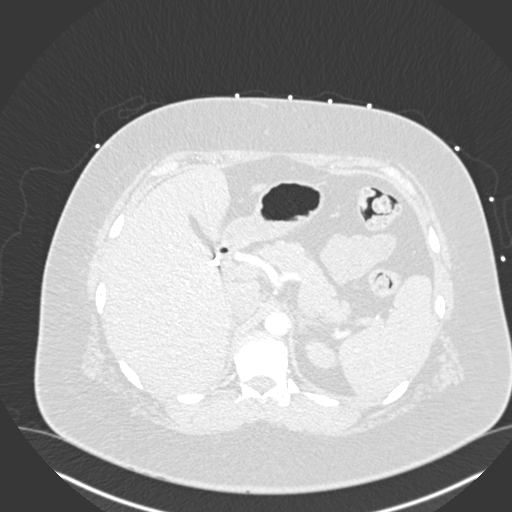
[im 26/258  mediastinal]
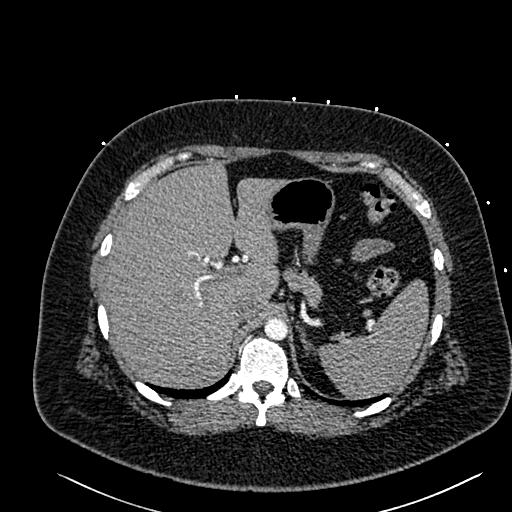
[im 39/258  lung]
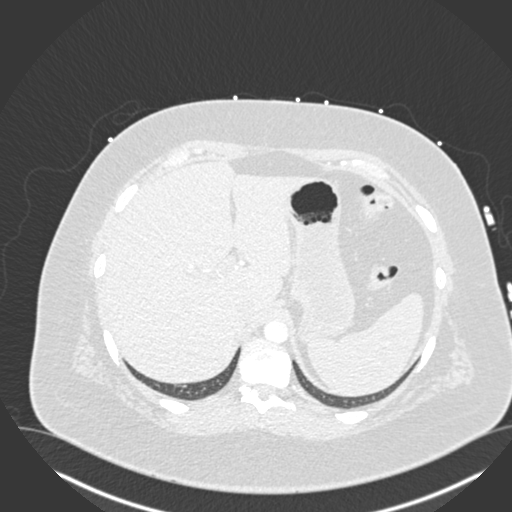
[im 52/258  mediastinal]
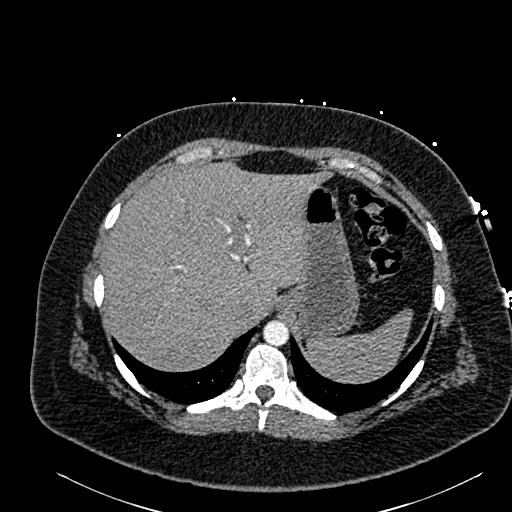
[im 65/258  lung]
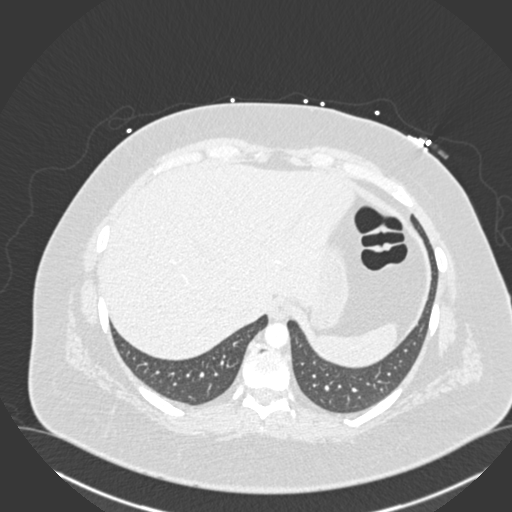
[im 78/258  mediastinal]
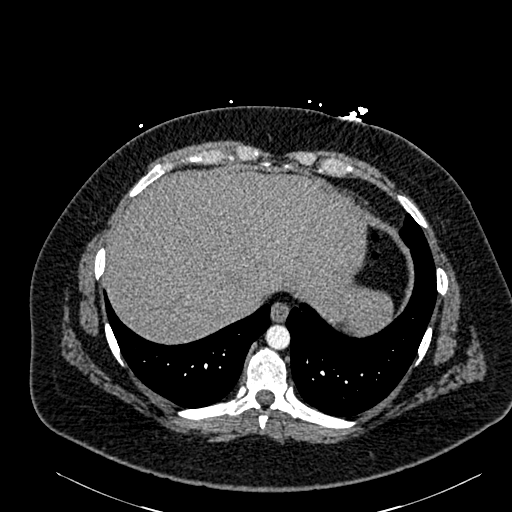
[im 90/258  lung]
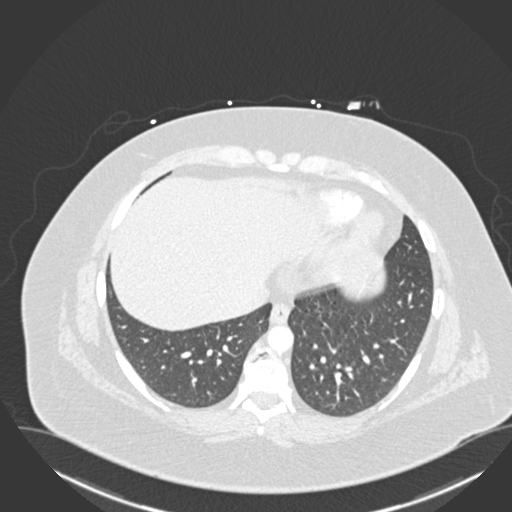
[im 103/258  mediastinal]
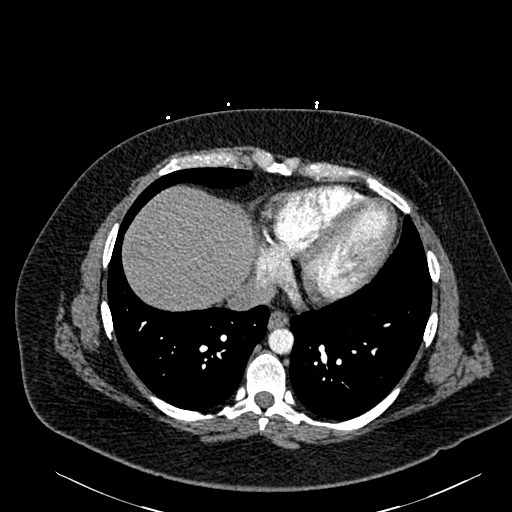
[im 116/258  lung]
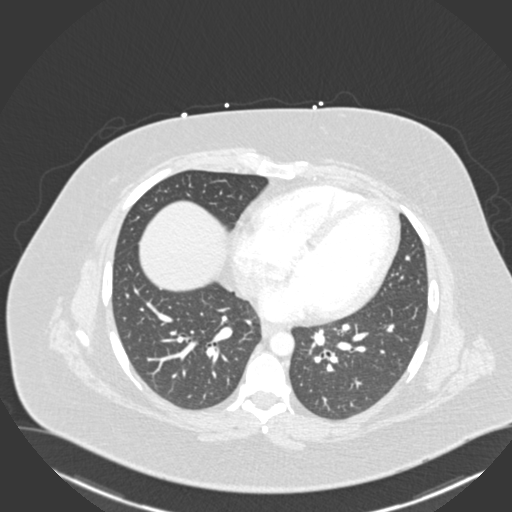
[im 142/258  mediastinal]
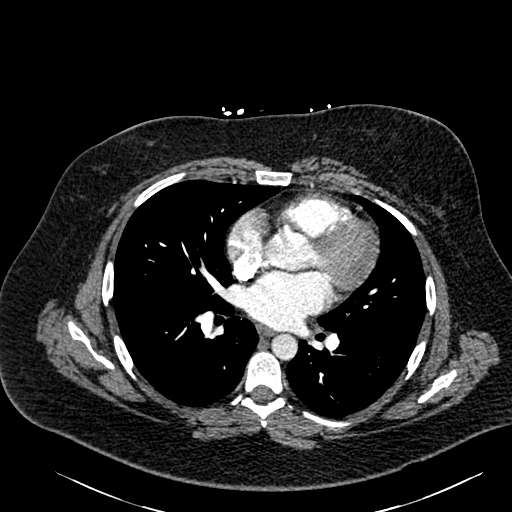
[im 155/258  lung]
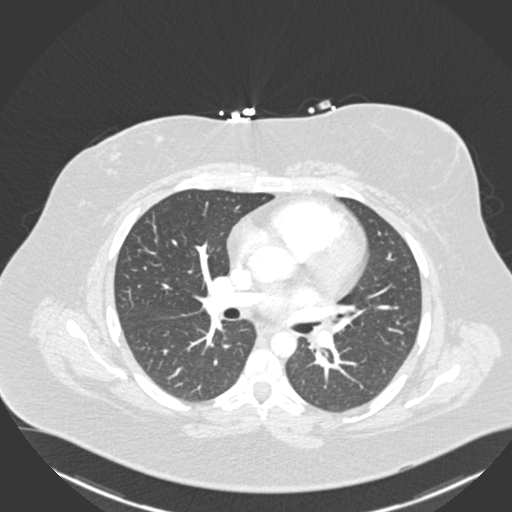
[im 168/258  mediastinal]
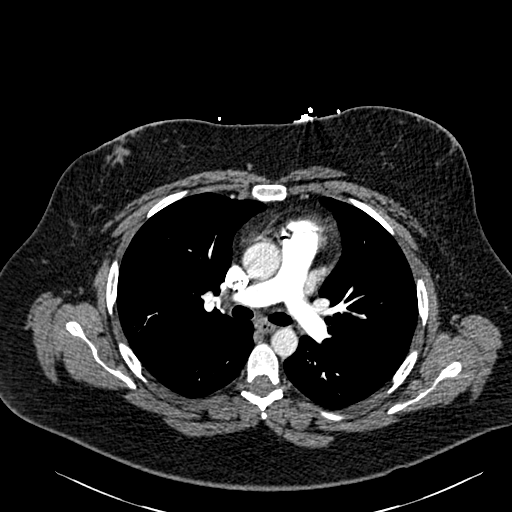
[im 180/258  lung]
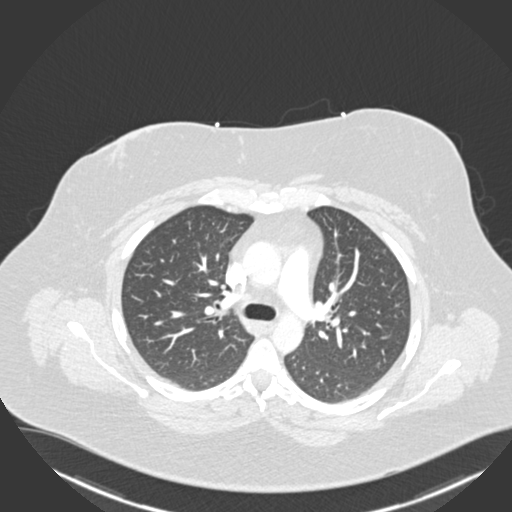
[im 193/258  mediastinal]
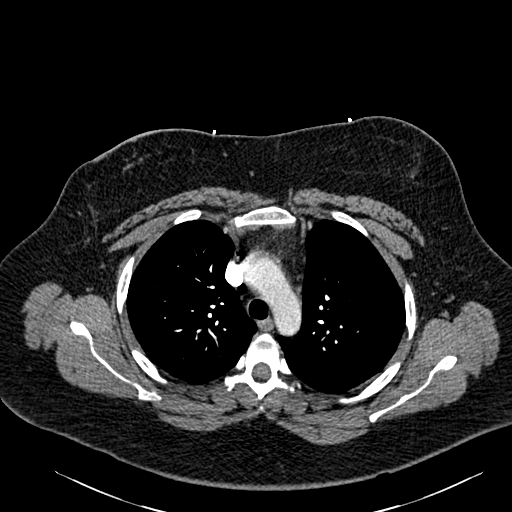
[im 206/258  lung]
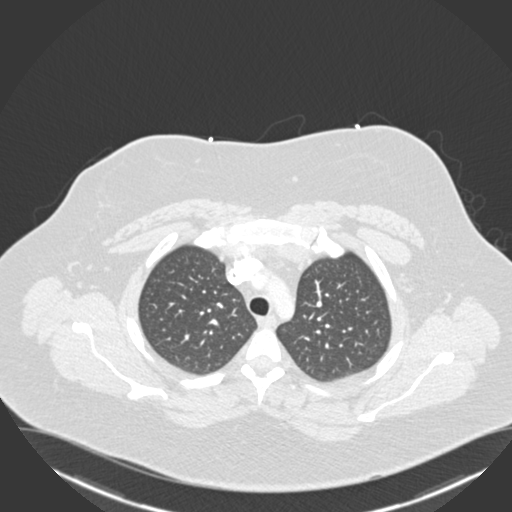
[im 219/258  mediastinal]
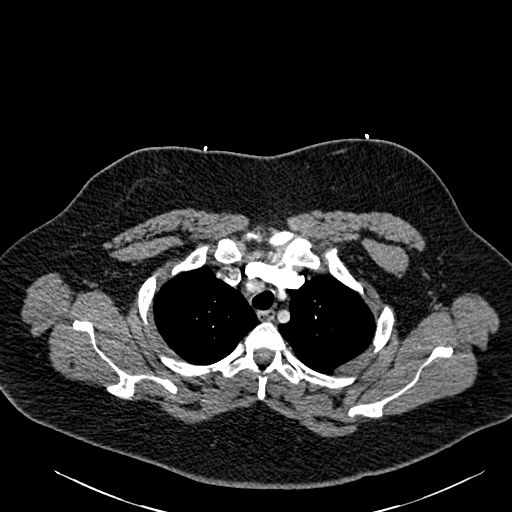
[im 232/258  lung]
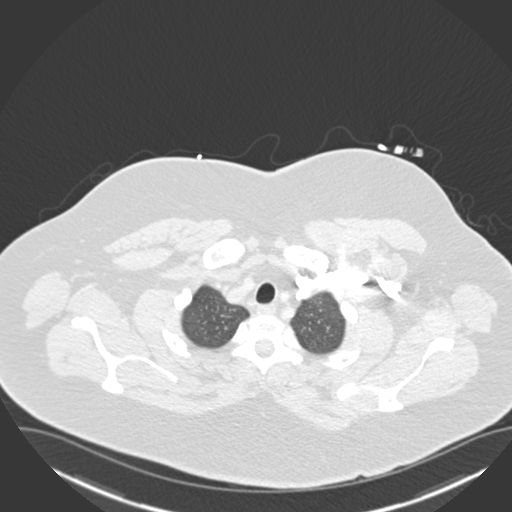
[im 245/258  mediastinal]
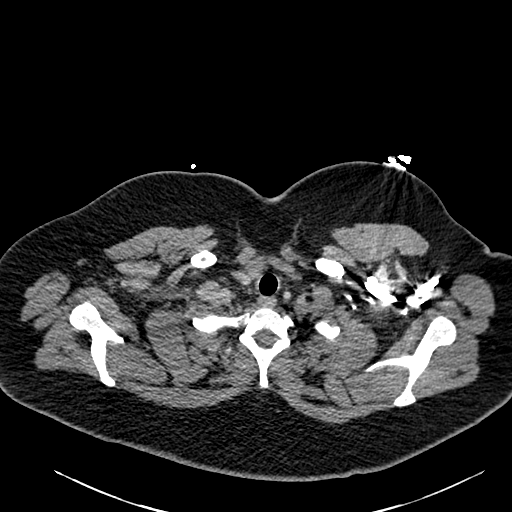

[Series 7: coronal mpr · coronal · 0.50mm/px · 1 of 95 slices shown]
[im 48/95  mediastinal]
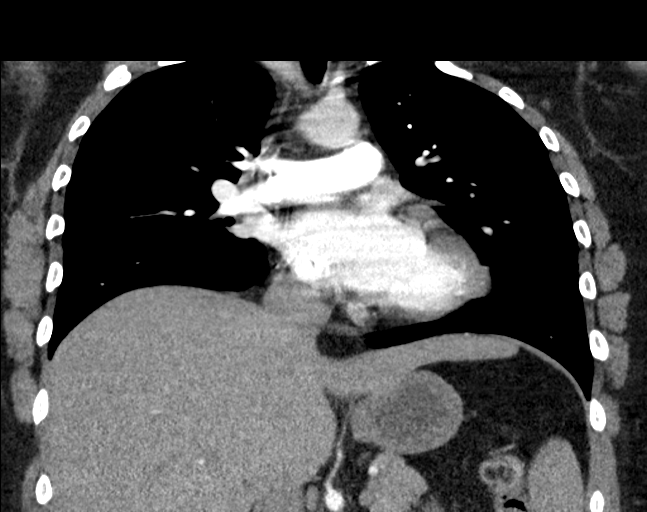

[19 of 36 positions shown; findings below may reference images not displayed]

FINDINGS: Cardiovascular: Satisfactory opacification of the pulmonary arteries
to the segmental level. No evidence of pulmonary embolism. Normal
heart size. No pericardial effusion. Nonaneurysmal aorta. No
dissection seen. Small amount of iatrogenic air in the pulmonary
trunk.

Mediastinum/Nodes: No enlarged mediastinal, hilar, or axillary lymph
nodes. Thyroid gland, trachea, and esophagus demonstrate no
significant findings.

Lungs/Pleura: Lungs are clear. No pleural effusion or pneumothorax.

Upper Abdomen: No acute abnormality. Surgical clips in the right
upper quadrant.

Musculoskeletal: No chest wall abnormality. No acute or significant
osseous findings.

Review of the MIP images confirms the above findings.
IMPRESSION: Negative for acute pulmonary embolus or aortic dissection. Clear
lung fields.

## 2022-08-10 ENCOUNTER — Other Ambulatory Visit: Payer: Self-pay

## 2022-08-10 ENCOUNTER — Ambulatory Visit
Admission: EM | Admit: 2022-08-10 | Discharge: 2022-08-10 | Disposition: A | Discharge status: Home / Self Care | Payer: Medicaid Other | Attending: Emergency Medicine | Admitting: Emergency Medicine

## 2022-08-10 DIAGNOSIS — R21 Rash and other nonspecific skin eruption: Secondary | ICD-10-CM

## 2022-08-10 HISTORY — DX: Tubal ligation status: Z98.51

## 2022-08-10 HISTORY — DX: History of uterine scar from previous surgery: Z98.891

## 2022-08-10 MED ORDER — PREDNISONE 10 MG (21) PO TBPK: ORAL_TABLET | Freq: Every day | ORAL | 0 refills | Status: AC

## 2022-08-10 NOTE — Discharge Instructions (Addendum)
Benadrly, topical cram,. Hydrocortisone, lessons itch but does not resolve, right above the umbilicus,   No drainge or fever. Was lifting boxes prior, has been [place] out,   Today you are being treated for rash that appears inflammatory, no current signs of infection   Starting tomorrow take prednisone every morning with food as directed, to help reduce the inflammatory process that occurs with this rash which will help minimize your itching as well as begin to clear  You may continue use of topical calamine or Benadryl cream to help manage itching, you may also continue oral Benadryl  Please avoid long exposures to heat such as a hot steamy shower or being outside as this may cause further irritation to your rash  You may follow-up with his urgent care as needed if symptoms persist or worsen

## 2022-08-10 NOTE — ED Triage Notes (Signed)
Rash to abd area since Thursdays.

## 2022-08-10 NOTE — ED Provider Notes (Signed)
[facility]    CSN: [id number] Arrival date & time: 08/10/22  1514      History   Chief Complaint Chief Complaint  Patient presents with   Rash    Entered by patient    HPI Denise Peterson is a 42 y.o. female.   Patient presents for evaluation of a rash to the abdomen present for 5 days.  Rash is erythematous and pruritic.  Only change in activity was moving boxes which caused her to become hot prior to symptoms beginning.  Denies drainage or fever.  Denies changes in toiletries, diet, recent travel or use of new medications.  Has attempted use of oral and topical antihistamines as well as topical hydrocortisone cream which does help to reduce pruritus but does not resolve symptoms.  Past Medical History:  Diagnosis Date   H/O cesarean section    H/O tubal ligation    Migraine    Mitral valve regurgitation     Patient Active Problem List   Diagnosis Date Noted   Left-sided weakness    Migraines 11/02/2016    Past Surgical History:  Procedure Laterality Date   CESAREAN SECTION     CHOLECYSTECTOMY      OB History   No obstetric history on file.      Home Medications    Prior to Admission medications   Medication Sig Start Date End Date Taking? Authorizing Provider  predniSONE (STERAPRED UNI-PAK 21 TAB) 10 MG (21) TBPK tablet Take by mouth daily. Take 6 tabs by mouth daily  for 1 days, then 5 tabs for 1 days, then 4 tabs for 1 days, then 3 tabs for 1 days, 2 tabs for 1 days, then 1 tab by mouth daily for 1 days 08/10/22  Yes Ashya Nicolaisen R, NP  amoxicillin (AMOXIL) 875 MG tablet Take 1 tablet (875 mg total) by mouth 2 (two) times daily. 04/21/18   Renford Dills, NP  etonogestrel (NEXPLANON) 68 MG IMPL implant 1 each by Subdermal route once.    [provider]  lidocaine (XYLOCAINE) 2 % solution Use as directed 10 mLs in the mouth or throat every 6 (six) hours as needed (sore throat. gargle and spit as needed for sore throat.). 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   Review of Systems Review of Systems   Physical Exam Triage Vital Signs ED Triage Vitals  Enc Vitals Group     BP 08/10/22 1542 111/81     Pulse Rate 08/10/22 1542 77     Resp 08/10/22 1542 20     Temp 08/10/22 1542 98.8 F (37.1 C)     Temp src --      SpO2 08/10/22 1542 100 %     Weight --      Height --      Head Circumference --      Peak  Flow --      Pain Score 08/10/22 1538 0     Pain Loc --      Pain Edu? --      Excl. in [place]? --    No data found.  Updated Vital Signs BP 111/81   Pulse 77   Temp 98.8 F (37.1 C)   Resp 20   LMP 07/17/2022 (Approximate)   SpO2 100%   Visual Acuity Right Eye Distance:   Left Eye Distance:   Bilateral Distance:    Right Eye Near:   Left Eye Near:    Bilateral Near:     Physical Exam Constitutional:      Appearance: Normal appearance.  Eyes:     Extraocular Movements: Extraocular movements intact.  Pulmonary:     Effort: Pulmonary effort is normal.  Skin:    Comments: Erythematous macular rash in the periumbilical region extending towards the epigastric region, defer to picture below  Neurological:     Mental Status: She is alert and oriented to person, place, and time. Mental status is at baseline.      UC Treatments / Results  Labs (all labs ordered are listed, but only abnormal results are displayed) Labs Reviewed - No data to  display  EKG   Radiology No results found.  Procedures Procedures (including critical care time)  Medications Ordered in UC Medications - No data to display  Initial Impression / Assessment and Plan / UC Course  I have reviewed the triage vital signs and the nursing notes.  Pertinent labs & imaging results that were available during my care of the patient were reviewed by me and considered in my medical decision making (see chart for details).  Rash  Unknown etiology, inflammatory process, no signs of infection, discussed with patient, prescribed prednisone taper and recommended continue use of antihistamines if helping with pruritus, advised follow-up if no improvement seen within 72 hours Final Clinical Impressions(s) / UC Diagnoses   Final diagnoses:  Rash     Discharge Instructions      Benadrly, topical cram,. Hydrocortisone, lessons itch but does not resolve, right above the umbilicus,   No drainge or fever. Was lifting boxes prior, has been [place] out,   Today you are being treated for rash that appears inflammatory, no current signs of infection   Starting tomorrow take prednisone every morning with food as directed, to help reduce the inflammatory process that occurs with this rash which will help minimize your itching as well as begin to clear  You may continue use of topical calamine or Benadryl cream to help manage itching, you may also continue oral Benadryl  Please avoid long exposures to heat such as a hot steamy shower or being outside as this may cause further irritation to your rash  You may follow-up with his urgent care as needed if symptoms persist or worsen     ED Prescriptions     Medication Sig Dispense Auth. Provider   predniSONE (STERAPRED UNI-PAK 21 TAB) 10 MG (21) TBPK tablet Take by mouth daily. Take 6 tabs by mouth daily  for 1 days, then 5 tabs for 1 days, then 4 tabs for 1 days, then 3 tabs for 1 days, 2 tabs for 1 days, then 1 tab  by mouth daily for 1 days 21 tablet Carmelita Amparo, Elita Boone, NP      PDMP not reviewed this encounter.   Valinda Hoar, NP 08/10/22 1615
